# Patient Record
Sex: Female | Born: 1965 | Race: Black or African American | Hispanic: No | Marital: Single | Smoking: Never smoker
Health system: Southern US, Community
[De-identification: ages and names within clinical notes are randomized; demographics above are authoritative.]

## PROBLEM LIST (undated history)

## (undated) DIAGNOSIS — Z862 Personal history of diseases of the blood and blood-forming organs and certain disorders involving the immune mechanism: Secondary | ICD-10-CM

## (undated) DIAGNOSIS — D219 Benign neoplasm of connective and other soft tissue, unspecified: Secondary | ICD-10-CM

## (undated) DIAGNOSIS — R87629 Unspecified abnormal cytological findings in specimens from vagina: Secondary | ICD-10-CM

## (undated) DIAGNOSIS — D649 Anemia, unspecified: Secondary | ICD-10-CM

## (undated) DIAGNOSIS — K259 Gastric ulcer, unspecified as acute or chronic, without hemorrhage or perforation: Secondary | ICD-10-CM

## (undated) DIAGNOSIS — N939 Abnormal uterine and vaginal bleeding, unspecified: Secondary | ICD-10-CM

## (undated) DIAGNOSIS — Z9289 Personal history of other medical treatment: Secondary | ICD-10-CM

## (undated) HISTORY — DX: Personal history of other medical treatment: Z92.89

## (undated) HISTORY — DX: Personal history of diseases of the blood and blood-forming organs and certain disorders involving the immune mechanism: Z86.2

## (undated) HISTORY — PX: GALLBLADDER SURGERY: SHX652

## (undated) HISTORY — PX: CHOLECYSTECTOMY: SHX55

## (undated) HISTORY — DX: Benign neoplasm of connective and other soft tissue, unspecified: D21.9

## (undated) HISTORY — PX: BREAST SURGERY: SHX581

## (undated) HISTORY — PX: TUBAL LIGATION: SHX77

## (undated) HISTORY — DX: Abnormal uterine and vaginal bleeding, unspecified: N93.9

## (undated) HISTORY — DX: Unspecified abnormal cytological findings in specimens from vagina: R87.629

---
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
## 1999-07-25 ENCOUNTER — Emergency Department (HOSPITAL_COMMUNITY): Admission: EM | Admit: 1999-07-25 | Discharge: 1999-07-25 | Payer: Self-pay | Admitting: Emergency Medicine

## 1999-07-26 ENCOUNTER — Encounter: Payer: Self-pay | Admitting: Emergency Medicine

## 2000-01-11 ENCOUNTER — Emergency Department (HOSPITAL_COMMUNITY): Admission: EM | Admit: 2000-01-11 | Discharge: 2000-01-11 | Payer: Self-pay | Admitting: Emergency Medicine

## 2000-03-26 HISTORY — PX: BREAST ENHANCEMENT SURGERY: SHX7

## 2000-07-18 ENCOUNTER — Emergency Department (HOSPITAL_COMMUNITY): Admission: EM | Admit: 2000-07-18 | Discharge: 2000-07-18 | Payer: Self-pay | Admitting: Emergency Medicine

## 2000-07-28 ENCOUNTER — Emergency Department (HOSPITAL_COMMUNITY): Admission: EM | Admit: 2000-07-28 | Discharge: 2000-07-28 | Payer: Self-pay | Admitting: Emergency Medicine

## 2000-09-13 ENCOUNTER — Other Ambulatory Visit: Admission: RE | Admit: 2000-09-13 | Discharge: 2000-09-13 | Payer: Self-pay | Admitting: Gynecology

## 2003-06-03 ENCOUNTER — Ambulatory Visit (HOSPITAL_COMMUNITY): Admission: RE | Admit: 2003-06-03 | Discharge: 2003-06-03 | Payer: Self-pay | Admitting: Family Medicine

## 2003-07-07 ENCOUNTER — Emergency Department (HOSPITAL_COMMUNITY): Admission: EM | Admit: 2003-07-07 | Discharge: 2003-07-07 | Payer: Self-pay | Admitting: Emergency Medicine

## 2003-07-08 ENCOUNTER — Emergency Department (HOSPITAL_COMMUNITY): Admission: EM | Admit: 2003-07-08 | Discharge: 2003-07-08 | Payer: Self-pay | Admitting: Emergency Medicine

## 2004-02-28 ENCOUNTER — Emergency Department (HOSPITAL_COMMUNITY): Admission: EM | Admit: 2004-02-28 | Discharge: 2004-02-28 | Payer: Self-pay | Admitting: Emergency Medicine

## 2005-10-16 ENCOUNTER — Emergency Department (HOSPITAL_COMMUNITY): Admission: EM | Admit: 2005-10-16 | Discharge: 2005-10-16 | Payer: Self-pay | Admitting: Emergency Medicine

## 2005-10-18 ENCOUNTER — Emergency Department (HOSPITAL_COMMUNITY): Admission: EM | Admit: 2005-10-18 | Discharge: 2005-10-18 | Payer: Self-pay | Admitting: Emergency Medicine

## 2006-05-09 ENCOUNTER — Encounter: Admission: RE | Admit: 2006-05-09 | Discharge: 2006-05-09 | Payer: Self-pay | Admitting: Obstetrics & Gynecology

## 2007-02-15 ENCOUNTER — Emergency Department (HOSPITAL_COMMUNITY): Admission: EM | Admit: 2007-02-15 | Discharge: 2007-02-15 | Payer: Self-pay | Admitting: Emergency Medicine

## 2007-10-14 ENCOUNTER — Emergency Department (HOSPITAL_COMMUNITY): Admission: EM | Admit: 2007-10-14 | Discharge: 2007-10-14 | Payer: Self-pay | Admitting: Emergency Medicine

## 2007-10-15 ENCOUNTER — Emergency Department (HOSPITAL_COMMUNITY): Admission: EM | Admit: 2007-10-15 | Discharge: 2007-10-15 | Payer: Self-pay | Admitting: Emergency Medicine

## 2007-10-16 ENCOUNTER — Emergency Department (HOSPITAL_COMMUNITY): Admission: EM | Admit: 2007-10-16 | Discharge: 2007-10-16 | Payer: Self-pay | Admitting: Family Medicine

## 2008-06-18 ENCOUNTER — Encounter: Admission: RE | Admit: 2008-06-18 | Discharge: 2008-06-18 | Payer: Self-pay | Admitting: Obstetrics & Gynecology

## 2008-06-23 ENCOUNTER — Encounter: Admission: RE | Admit: 2008-06-23 | Discharge: 2008-06-23 | Payer: Self-pay | Admitting: Obstetrics & Gynecology

## 2008-12-14 ENCOUNTER — Emergency Department (HOSPITAL_COMMUNITY): Admission: EM | Admit: 2008-12-14 | Discharge: 2008-12-14 | Payer: Self-pay | Admitting: Emergency Medicine

## 2009-08-08 ENCOUNTER — Emergency Department (HOSPITAL_COMMUNITY): Admission: EM | Admit: 2009-08-08 | Discharge: 2009-08-08 | Payer: Self-pay | Admitting: Family Medicine

## 2010-06-12 LAB — POCT URINALYSIS DIP (DEVICE)
Glucose, UA: NEGATIVE mg/dL
Hgb urine dipstick: NEGATIVE
Ketones, ur: NEGATIVE mg/dL

## 2010-06-12 LAB — POCT I-STAT, CHEM 8
BUN: 4 mg/dL — ABNORMAL LOW (ref 6–23)
BUN: 4 mg/dL — ABNORMAL LOW (ref 6–23)
Calcium, Ion: 1.1 mmol/L — ABNORMAL LOW (ref 1.12–1.32)
Calcium, Ion: 1.11 mmol/L — ABNORMAL LOW (ref 1.12–1.32)
Chloride: 106 mEq/L (ref 96–112)
HCT: 30 % — ABNORMAL LOW (ref 36.0–46.0)
Hemoglobin: 10.2 g/dL — ABNORMAL LOW (ref 12.0–15.0)
Potassium: 4.1 mEq/L (ref 3.5–5.1)
Sodium: 142 mEq/L (ref 135–145)

## 2010-06-12 LAB — WET PREP, GENITAL
Trich, Wet Prep: NONE SEEN
Yeast Wet Prep HPF POC: NONE SEEN

## 2010-12-22 LAB — CBC
HCT: 31.7 — ABNORMAL LOW
Hemoglobin: 10.1 — ABNORMAL LOW
MCHC: 31.7
MCV: 70.5 — ABNORMAL LOW
Platelets: 325
RBC: 4.5
RDW: 17 — ABNORMAL HIGH
WBC: 7.2

## 2010-12-22 LAB — DIFFERENTIAL
Basophils Absolute: 0.1
Eosinophils Relative: 1
Lymphocytes Relative: 28
Neutrophils Relative %: 64

## 2010-12-22 LAB — POCT I-STAT, CHEM 8
BUN: 6
Calcium, Ion: 1.16
Calcium, Ion: 1.2
Chloride: 103
Chloride: 105
Creatinine, Ser: 1.2
Glucose, Bld: 104 — ABNORMAL HIGH
HCT: 33 — ABNORMAL LOW
HCT: 34 — ABNORMAL LOW
Potassium: 4.4

## 2010-12-22 LAB — POCT PREGNANCY, URINE
Operator id: 24446
Operator id: 264031
Preg Test, Ur: NEGATIVE
Preg Test, Ur: NEGATIVE

## 2010-12-22 LAB — POCT H PYLORI SCREEN: H. PYLORI SCREEN, POC: POSITIVE — AB

## 2010-12-22 LAB — LIPASE, BLOOD: Lipase: 30

## 2010-12-22 LAB — COMPREHENSIVE METABOLIC PANEL
CO2: 27
Calcium: 8.8
Chloride: 109
Creatinine, Ser: 0.84
GFR calc non Af Amer: >60
Glucose, Bld: 102 — ABNORMAL HIGH
Total Bilirubin: 1.7 — ABNORMAL HIGH

## 2010-12-22 LAB — URINE MICROSCOPIC-ADD ON

## 2010-12-22 LAB — URINALYSIS, ROUTINE W REFLEX MICROSCOPIC
Glucose, UA: NEGATIVE
Hgb urine dipstick: NEGATIVE
Hgb urine dipstick: NEGATIVE
Ketones, ur: NEGATIVE
Nitrite: NEGATIVE
Protein, ur: NEGATIVE
Protein, ur: NEGATIVE
Specific Gravity, Urine: 1.009
Specific Gravity, Urine: 1.011
Urobilinogen, UA: 0.2
Urobilinogen, UA: 1
pH: 6

## 2010-12-22 LAB — POCT CARDIAC MARKERS: Troponin i, poc: <0.05

## 2011-01-14 ENCOUNTER — Emergency Department (HOSPITAL_COMMUNITY)
Admission: EM | Admit: 2011-01-14 | Discharge: 2011-01-14 | Disposition: A | Discharge status: Home / Self Care | Payer: Self-pay | Attending: Emergency Medicine | Admitting: Emergency Medicine

## 2011-01-14 DIAGNOSIS — R059 Cough, unspecified: Secondary | ICD-10-CM | POA: Insufficient documentation

## 2011-01-14 DIAGNOSIS — J329 Chronic sinusitis, unspecified: Secondary | ICD-10-CM | POA: Insufficient documentation

## 2011-01-14 DIAGNOSIS — J069 Acute upper respiratory infection, unspecified: Secondary | ICD-10-CM | POA: Insufficient documentation

## 2011-01-14 DIAGNOSIS — R05 Cough: Secondary | ICD-10-CM | POA: Insufficient documentation

## 2011-01-14 DIAGNOSIS — R509 Fever, unspecified: Secondary | ICD-10-CM | POA: Insufficient documentation

## 2011-08-02 ENCOUNTER — Emergency Department (HOSPITAL_COMMUNITY): Payer: Self-pay

## 2011-08-02 ENCOUNTER — Emergency Department (HOSPITAL_COMMUNITY)
Admission: EM | Admit: 2011-08-02 | Discharge: 2011-08-02 | Disposition: A | Discharge status: Home / Self Care | Payer: Self-pay | Attending: Emergency Medicine | Admitting: Emergency Medicine

## 2011-08-02 ENCOUNTER — Encounter (HOSPITAL_COMMUNITY): Payer: Self-pay | Admitting: Emergency Medicine

## 2011-08-02 DIAGNOSIS — R079 Chest pain, unspecified: Secondary | ICD-10-CM | POA: Insufficient documentation

## 2011-08-02 DIAGNOSIS — D649 Anemia, unspecified: Secondary | ICD-10-CM | POA: Insufficient documentation

## 2011-08-02 DIAGNOSIS — M549 Dorsalgia, unspecified: Secondary | ICD-10-CM | POA: Insufficient documentation

## 2011-08-02 LAB — CBC
Hemoglobin: 7.9 g/dL — ABNORMAL LOW (ref 12.0–15.0)
MCH: 18.9 pg — ABNORMAL LOW (ref 26.0–34.0)
MCHC: 28.6 g/dL — ABNORMAL LOW (ref 30.0–36.0)
Platelets: 467 10*3/uL — ABNORMAL HIGH (ref 150–400)
RBC: 4.19 MIL/uL (ref 3.87–5.11)

## 2011-08-02 LAB — BASIC METABOLIC PANEL
BUN: 8 mg/dL (ref 6–23)
Chloride: 106 mEq/L (ref 96–112)
GFR calc Af Amer: >90 mL/min (ref >90)
GFR calc non Af Amer: 88 mL/min — ABNORMAL LOW (ref >90)
Potassium: 3.5 mEq/L (ref 3.5–5.1)
Sodium: 140 mEq/L (ref 135–145)

## 2011-08-02 LAB — DIFFERENTIAL
Basophils Relative: 1 % (ref 0–1)
Eosinophils Absolute: 0.3 10*3/uL (ref 0.0–0.7)
Lymphs Abs: 3.1 10*3/uL (ref 0.7–4.0)
Monocytes Relative: 6 % (ref 3–12)
Neutro Abs: 3.5 10*3/uL (ref 1.7–7.7)
Neutrophils Relative %: 47 % (ref 43–77)

## 2011-08-02 MED ORDER — FERROUS SULFATE 325 (65 FE) MG PO TABS: 325.0000 mg | ORAL_TABLET | Freq: Every day | ORAL | Status: DC

## 2011-08-02 NOTE — ED Provider Notes (Signed)
History     CSN: [id number]  Arrival date & time 08/02/11  0202   First MD Initiated Contact with Patient 08/02/11 0235      Chief Complaint  Patient presents with  . Chest Pain    (Consider location/radiation/quality/duration/timing/severity/associated sxs/prior treatment) HPI Patient presents with complaint of chest pain. She states the chest pain is located in her mid sternum and also feels some mild pain in her upper back. She denies shortness of breath nausea or diaphoresis. Pain does not radiate. She states she has had similar pain previously off and on. She's had no fever or cough. No history of DVT or PE. No leg swelling, no recent surgery or, or recent travel.  Pain is present at rest, no association with exertion, there are no other associated systemic symptoms, there are no alleviating or modifying factors.   History reviewed. No pertinent past medical history.  Past Surgical History  Procedure Date  . Cholecystectomy   . Breast enhancement surgery     No family history on file.  History  Substance Use Topics  . Smoking status: Never Smoker   . Smokeless tobacco: Not on file  . Alcohol Use: No    OB History    Grav Para Term Preterm Abortions TAB SAB Ect Mult Living                  Review of Systems ROS reviewed and all otherwise negative except for mentioned in HPI  Allergies  Review of patient's allergies indicates no known allergies.  Home Medications   Current Outpatient Rx  Name Route Sig Dispense Refill  . FERROUS SULFATE 325 (65 FE) MG PO TABS Oral Take 1 tablet (325 mg total) by mouth daily. 30 tablet 0    BP 110/67  Pulse 66  Temp(Src) 97.9 F (36.6 C) (Oral)  Resp 17  SpO2 99%  LMP 07/25/2011 Vitals reviewed Physical Exam Physical Examination: General appearance - alert, well appearing, and in no distress Mental status - alert, oriented to person, place, and time Eyes - pupils equal and reactive, no conjunctival injection Mouth -  mucous membranes moist, pharynx normal without lesions Chest - clear to auscultation, no wheezes, rales or rhonchi, symmetric air entry, mild tenderness to palpation over anterior chest wall Heart - normal rate, regular rhythm, normal S1, S2, no murmurs, rubs, clicks or gallops Abdomen - soft, nontender, nondistended, no masses or organomegaly Musculoskeletal - no joint tenderness, deformity or swelling Extremities - peripheral pulses normal, no pedal edema, no clubbing or cyanosis Skin - normal coloration and turgor, no rashes, brisk cap refill  ED Course  Procedures (including critical care time)   Date: 08/02/2011  Rate: 71  Rhythm: normal sinus rhythm with sinus arrythmia  QRS Axis: normal  Intervals: normal  ST/T Wave abnormalities: normal  Conduction Disutrbances: none  Narrative Interpretation: unremarkable     3:06 AM labs not crossing over - istat troponin 0.00  5:59 AM 2nd, 3 hour troponin also 0.00.  Labs Reviewed  CBC - Abnormal; Notable for the following:    Hemoglobin 7.9 (*)    HCT 27.6 (*)    MCV 65.9 (*)    MCH 18.9 (*)    MCHC 28.6 (*)    RDW 18.5 (*)    Platelets 467 (*)    All other components within normal limits  BASIC METABOLIC PANEL - Abnormal; Notable for the following:    Glucose, Bld 106 (*)    GFR calc non Af Denyse Dago  88 (*)    All other components within normal limits  DIFFERENTIAL  LAB REPORT - SCANNED  POCT I-STAT TROPONIN I  POCT I-STAT TROPONIN I   No results found.   1. Chest pain   2. Anemia       MDM  Patient presents with complaint of chest pain. Pain is somewhat reproducible on exam it is not associated with exertion. Patient has low risk of a CVS and is also per score 0 which makes her very low risk for PE. She was found to be anemic on workup in the ED tonight. She states she has had a history of anemia in the past but has not taken any medication or had any other treatments for this. She denies any feelings of  lightheadedness or fainting. She's not short of breath. Patient was started on ferrous sulfate to take 3 times daily and was instructed to arrange for followup with her primary care Dr.         Ethelda Chick, MD 08/04/11 [phone]

## 2011-08-02 NOTE — Discharge Instructions (Signed)
Return to the ED with any concerns including difficulty breathing, fainting, leg swelling, decreased level of alertness/lethargy, or any other alarming symptoms °

## 2011-08-02 NOTE — ED Notes (Signed)
Dr Karma Ganja notified of 7.9 hemaglobin

## 2011-08-02 NOTE — ED Notes (Addendum)
Pt awoken to take orthostatic vs.  Pt stated increased chest pain when laid flat for orthostatics and stated immediate decreased cp when moved to sitting position.

## 2011-08-02 NOTE — ED Notes (Signed)
I-stat troponin result given to Dr. Karma Ganja due to results no crossing over in [facility]

## 2011-08-02 NOTE — ED Notes (Signed)
Pt awoken to give d/c papers.  Pt states she works 2 full-time jobs and is exhausted.

## 2011-08-02 NOTE — ED Notes (Signed)
PT. REPORTS MID CHEST PAIN RADIATING TO MID BACK WITH NAUSEA ONSET THIS MORNING , DENIES SOB OR COUGH.

## 2011-08-03 LAB — POCT I-STAT TROPONIN I: Troponin i, poc: 0 ng/mL (ref 0.00–0.08)

## 2011-09-17 DIAGNOSIS — R109 Unspecified abdominal pain: Secondary | ICD-10-CM | POA: Insufficient documentation

## 2011-09-17 DIAGNOSIS — R197 Diarrhea, unspecified: Secondary | ICD-10-CM | POA: Insufficient documentation

## 2011-09-18 ENCOUNTER — Encounter (HOSPITAL_COMMUNITY): Payer: Self-pay | Admitting: Emergency Medicine

## 2011-09-18 ENCOUNTER — Emergency Department (HOSPITAL_COMMUNITY): Payer: Self-pay

## 2011-09-18 ENCOUNTER — Emergency Department (HOSPITAL_COMMUNITY)
Admission: EM | Admit: 2011-09-18 | Discharge: 2011-09-18 | Disposition: A | Discharge status: Home / Self Care | Payer: Self-pay | Attending: Emergency Medicine | Admitting: Emergency Medicine

## 2011-09-18 DIAGNOSIS — R197 Diarrhea, unspecified: Secondary | ICD-10-CM

## 2011-09-18 HISTORY — DX: Anemia, unspecified: D64.9

## 2011-09-18 HISTORY — DX: Gastric ulcer, unspecified as acute or chronic, without hemorrhage or perforation: K25.9

## 2011-09-18 LAB — BASIC METABOLIC PANEL
Calcium: 8.8 mg/dL (ref 8.4–10.5)
GFR calc Af Amer: >90 mL/min (ref >90)
GFR calc non Af Amer: 89 mL/min — ABNORMAL LOW (ref >90)
Glucose, Bld: 103 mg/dL — ABNORMAL HIGH (ref 70–99)
Potassium: 3.3 mEq/L — ABNORMAL LOW (ref 3.5–5.1)
Sodium: 135 mEq/L (ref 135–145)

## 2011-09-18 LAB — CBC
HCT: 28.6 % — ABNORMAL LOW (ref 36.0–46.0)
Hemoglobin: 8.2 g/dL — ABNORMAL LOW (ref 12.0–15.0)
MCH: 18.6 pg — ABNORMAL LOW (ref 26.0–34.0)
MCHC: 28.7 g/dL — ABNORMAL LOW (ref 30.0–36.0)
MCV: 64.9 fL — ABNORMAL LOW (ref 78.0–100.0)
Platelets: 302 K/uL (ref 150–400)
RBC: 4.41 MIL/uL (ref 3.87–5.11)
RDW: 18.7 % — ABNORMAL HIGH (ref 11.5–15.5)
WBC: 6.3 K/uL (ref 4.0–10.5)

## 2011-09-18 LAB — DIFFERENTIAL
Basophils Absolute: 0.1 10*3/uL (ref 0.0–0.1)
Eosinophils Absolute: 0.3 10*3/uL (ref 0.0–0.7)
Lymphocytes Relative: 30 % (ref 12–46)
Monocytes Absolute: 0.4 10*3/uL (ref 0.1–1.0)
Neutrophils Relative %: 58 % (ref 43–77)

## 2011-09-18 LAB — URINALYSIS, ROUTINE W REFLEX MICROSCOPIC
Hgb urine dipstick: NEGATIVE
Nitrite: NEGATIVE
Protein, ur: NEGATIVE mg/dL
Urobilinogen, UA: 4 mg/dL — ABNORMAL HIGH (ref 0.0–1.0)

## 2011-09-18 LAB — PREGNANCY, URINE: Preg Test, Ur: NEGATIVE

## 2011-09-18 LAB — OCCULT BLOOD, POC DEVICE: Fecal Occult Bld: NEGATIVE

## 2011-09-18 NOTE — ED Notes (Signed)
Warm blanket given for pt comfort.

## 2011-09-18 NOTE — ED Notes (Signed)
Pt states for a few days she has been bloating and having uncontrollable bowel movements  States her stools are black in color  Pt states she has been having abd pain  Pt states she also noticed her urine has been dark in color  Pt states she is anemic and has a low blood count  Pt states she has been eating a lot of ice  Pt states she has also had some shortness of breath and feeling like she may pass out at time but states she has not done it yet

## 2011-09-18 NOTE — ED Notes (Signed)
ABD pain controlled/ decreased, VSS, no s/s of distress or discomfort. D/C instructions reviewed, all questions and concerns addressed. Pt discharged home.

## 2011-09-18 NOTE — ED Provider Notes (Signed)
History     CSN: [id number]  Arrival date & time 09/17/11  2323   First MD Initiated Contact with Patient 09/18/11 [phone]      Chief Complaint  Patient presents with  . Abdominal Pain     The history is provided by the patient.   the patient reports developing significant loose stools over the past 24 hours.  She denies melena or hematochezia.  She does report abdominal bloating.  Prior to that she had decreased bowel movements.  She denies fevers or chills.  She does not have abdominal pain.  Her abdomen is reported as bloating. nothing Improves or worsens the symptoms.   Past Medical History  Diagnosis Date  . Anemia   . Gastric ulcer     Past Surgical History  Procedure Date  . Cholecystectomy   . Breast enhancement surgery     Family History  Problem Relation Age of Onset  . Hypertension Other   . Cancer Other   . Hyperlipidemia Other     History  Substance Use Topics  . Smoking status: Never Smoker   . Smokeless tobacco: Not on file  . Alcohol Use: No    OB History    Grav Para Term Preterm Abortions TAB SAB Ect Mult Living                  Review of Systems  Gastrointestinal: Positive for abdominal pain.  All other systems reviewed and are negative.    Allergies  Review of patient's allergies indicates no known allergies.  Home Medications   Current Outpatient Rx  Name Route Sig Dispense Refill  . FERROUS SULFATE 325 (65 FE) MG PO TABS Oral Take 1 tablet (325 mg total) by mouth daily. 30 tablet 0    BP 101/62  Pulse 58  Temp 98 F (36.7 C) (Oral)  Resp 20  SpO2 100%  LMP 09/17/2011  Physical Exam  Nursing note and vitals reviewed. Constitutional: She is oriented to person, place, and time. She appears well-developed and well-nourished. No distress.  HENT:  Head: Normocephalic and atraumatic.  Eyes: EOM are normal.  Neck: Normal range of motion.  Cardiovascular: Normal rate, regular rhythm and normal heart sounds.   Pulmonary/Chest:  Effort normal and breath sounds normal.  Abdominal: Soft. She exhibits no distension. There is no tenderness.  Musculoskeletal: Normal range of motion.  Neurological: She is alert and oriented to person, place, and time.  Skin: Skin is warm and dry.  Psychiatric: She has a normal mood and affect. Judgment normal.    ED Course  Procedures (including critical care time)  Labs Reviewed  CBC - Abnormal; Notable for the following:    Hemoglobin 8.2 (*)  REPEATED TO VERIFY   HCT 28.6 (*)     MCV 64.9 (*)     MCH 18.6 (*)     MCHC 28.7 (*)     RDW 18.7 (*)     All other components within normal limits  BASIC METABOLIC PANEL - Abnormal; Notable for the following:    Potassium 3.3 (*)     Glucose, Bld 103 (*)     GFR calc non Af Amer 89 (*)     All other components within normal limits  URINALYSIS, ROUTINE W REFLEX MICROSCOPIC - Abnormal; Notable for the following:    Urobilinogen, UA 4.0 (*)     All other components within normal limits  DIFFERENTIAL  PREGNANCY, URINE   Dg Abd 2 Views  09/18/2011  *  RADIOLOGY REPORT*  Clinical Data: Abdominal pain, nausea and vomiting.  ABDOMEN - 2 VIEW  Comparison: 10/15/2007  Findings: Borderline prominence gas and stool noted in the colon. No dilated small bowel or abnormal air-fluid levels noted.  Mild dextroconvex thoracolumbar scoliosis is observed.  IMPRESSION:  1. Prominence of stool throughout the colon suggests constipation. 2.  Mild thoracolumbar scoliosis.  Original Report Authenticated By: Dellia Cloud, M.D.     1. Diarrhea       MDM  Radiographically the patient has increase stool burden.  Some of this may represent constipation with diarrhea loose stools coming around the stool burden.  I recommended a short course stool softeners to see if this helps.  She understands importance of hydration at home.  She will return the emergency apartment for new or worsening symptoms.      Lyanne Co, MD 09/18/11 [phone]

## 2012-06-09 ENCOUNTER — Emergency Department (HOSPITAL_COMMUNITY)
Admission: EM | Admit: 2012-06-09 | Discharge: 2012-06-09 | Disposition: A | Discharge status: Home / Self Care | Payer: No Typology Code available for payment source | Attending: Emergency Medicine | Admitting: Emergency Medicine

## 2012-06-09 ENCOUNTER — Emergency Department (HOSPITAL_COMMUNITY): Payer: No Typology Code available for payment source

## 2012-06-09 ENCOUNTER — Encounter (HOSPITAL_COMMUNITY): Payer: Self-pay | Admitting: *Deleted

## 2012-06-09 DIAGNOSIS — R109 Unspecified abdominal pain: Secondary | ICD-10-CM | POA: Insufficient documentation

## 2012-06-09 DIAGNOSIS — R42 Dizziness and giddiness: Secondary | ICD-10-CM | POA: Insufficient documentation

## 2012-06-09 DIAGNOSIS — Z3202 Encounter for pregnancy test, result negative: Secondary | ICD-10-CM | POA: Insufficient documentation

## 2012-06-09 DIAGNOSIS — R110 Nausea: Secondary | ICD-10-CM | POA: Insufficient documentation

## 2012-06-09 DIAGNOSIS — Z862 Personal history of diseases of the blood and blood-forming organs and certain disorders involving the immune mechanism: Secondary | ICD-10-CM | POA: Insufficient documentation

## 2012-06-09 DIAGNOSIS — R197 Diarrhea, unspecified: Secondary | ICD-10-CM | POA: Insufficient documentation

## 2012-06-09 DIAGNOSIS — R079 Chest pain, unspecified: Secondary | ICD-10-CM | POA: Insufficient documentation

## 2012-06-09 DIAGNOSIS — Z8711 Personal history of peptic ulcer disease: Secondary | ICD-10-CM | POA: Insufficient documentation

## 2012-06-09 LAB — URINALYSIS, ROUTINE W REFLEX MICROSCOPIC
Leukocytes, UA: NEGATIVE
Nitrite: NEGATIVE
Specific Gravity, Urine: 1.029 (ref 1.005–1.030)
pH: 5 (ref 5.0–8.0)

## 2012-06-09 LAB — CBC WITH DIFFERENTIAL/PLATELET
Basophils Absolute: 0 10*3/uL (ref 0.0–0.1)
Eosinophils Absolute: 0.2 10*3/uL (ref 0.0–0.7)
Lymphocytes Relative: 12 % (ref 12–46)
MCHC: 28.2 g/dL — ABNORMAL LOW (ref 30.0–36.0)
Neutro Abs: 6.3 10*3/uL (ref 1.7–7.7)
Neutrophils Relative %: 78 % — ABNORMAL HIGH (ref 43–77)
Platelets: 428 10*3/uL — ABNORMAL HIGH (ref 150–400)
RDW: 18.6 % — ABNORMAL HIGH (ref 11.5–15.5)

## 2012-06-09 LAB — POCT I-STAT TROPONIN I
Troponin i, poc: 0 ng/mL (ref 0.00–0.08)
Troponin i, poc: 0 ng/mL (ref 0.00–0.08)
Troponin i, poc: 0.01 ng/mL (ref 0.00–0.08)

## 2012-06-09 LAB — COMPREHENSIVE METABOLIC PANEL
Albumin: 3.8 g/dL (ref 3.5–5.2)
Alkaline Phosphatase: 54 U/L (ref 39–117)
BUN: 9 mg/dL (ref 6–23)
Potassium: 3.3 mEq/L — ABNORMAL LOW (ref 3.5–5.1)
Sodium: 138 mEq/L (ref 135–145)
Total Protein: 7.5 g/dL (ref 6.0–8.3)

## 2012-06-09 LAB — LIPASE, BLOOD: Lipase: 32 U/L (ref 11–59)

## 2012-06-09 LAB — POCT PREGNANCY, URINE: Preg Test, Ur: NEGATIVE

## 2012-06-09 MED ORDER — ONDANSETRON 4 MG PO TBDP: 4.0000 mg | ORAL_TABLET | Freq: Three times a day (TID) | ORAL | Status: DC | PRN

## 2012-06-09 MED ORDER — SODIUM CHLORIDE 0.9 % IV BOLUS (SEPSIS)
1000.0000 mL | Freq: Once | INTRAVENOUS | Status: AC
  Administered 2012-06-09: 1000 mL via INTRAVENOUS

## 2012-06-09 MED ORDER — MORPHINE SULFATE 4 MG/ML IJ SOLN
4.0000 mg | Freq: Once | INTRAMUSCULAR | Status: AC
  Administered 2012-06-09: 4 mg via INTRAVENOUS
  Filled 2012-06-09: qty 1

## 2012-06-09 MED ORDER — GI COCKTAIL ~~LOC~~
30.0000 mL | Freq: Once | ORAL | Status: AC
  Administered 2012-06-09: 30 mL via ORAL
  Filled 2012-06-09: qty 30

## 2012-06-09 MED ORDER — ONDANSETRON HCL 4 MG/2ML IJ SOLN
4.0000 mg | Freq: Once | INTRAMUSCULAR | Status: AC
  Administered 2012-06-09: 4 mg via INTRAVENOUS
  Filled 2012-06-09: qty 2

## 2012-06-09 NOTE — ED Notes (Signed)
Esign would not work.    Patient did sign on the pad.

## 2012-06-09 NOTE — ED Provider Notes (Signed)
Medical screening examination/treatment/procedure(s) were conducted as a shared visit with non-physician practitioner(s) and myself.  I personally evaluated the patient during the encounter  Nausea, vomiting, diarrhea x 2 days with sick contacts. Abdomen soft without peritoneal signs.  Glynn Octave, MD 06/09/12 [phone]

## 2012-06-09 NOTE — ED Notes (Signed)
Pt is here with couple days of nausea and diarrhea.  Pt reports dizziness and weakness

## 2012-06-09 NOTE — ED Provider Notes (Signed)
History     CSN: [id number]  Arrival date & time 06/09/12  0910   First MD Initiated Contact with Patient 06/09/12 [phone]      Chief Complaint  Patient presents with  . Nausea  . Diarrhea  . Dizziness    (Consider location/radiation/quality/duration/timing/severity/associated sxs/prior treatment) HPI Comments: Pt presents to the ED for nausea and non-bloody diarrhea x 2 days.  Has been taking care of her grandchild who had similar symptoms.  Continues to eat and drink in small amounts.  Is now having diffuse, crampy, abdominal pain with some sensations of light-headedness with abrupt movements.  Denies any chest pain, SOB, vomiting, dysuria, or vaginal discharge.  Prior cholecystectomy.  Patient is a 47 y.o. female presenting with diarrhea. The history is provided by the patient.  Diarrhea   Past Medical History  Diagnosis Date  . Anemia   . Gastric ulcer     Past Surgical History  Procedure Laterality Date  . Cholecystectomy    . Breast enhancement surgery      Family History  Problem Relation Age of Onset  . Hypertension Other   . Cancer Other   . Hyperlipidemia Other     History  Substance Use Topics  . Smoking status: Never Smoker   . Smokeless tobacco: Not on file  . Alcohol Use: No    OB History   Grav Para Term Preterm Abortions TAB SAB Ect Mult Living                  Review of Systems  Gastrointestinal: Positive for nausea and diarrhea.  Neurological: Positive for light-headedness.  All other systems reviewed and are negative.    Allergies  Review of patient's allergies indicates no known allergies.  Home Medications  No current outpatient prescriptions on file.  BP 106/66  Pulse 83  Temp(Src) 98.5 F (36.9 C) (Oral)  Resp 18  SpO2 100%  Physical Exam  Nursing note and vitals reviewed. Constitutional: She is oriented to person, place, and time. She appears well-developed and well-nourished.  HENT:  Head: Normocephalic and  atraumatic.  Right Ear: Tympanic membrane and ear canal normal.  Left Ear: Tympanic membrane and ear canal normal.  Mouth/Throat: Uvula is midline, oropharynx is clear and moist and mucous membranes are normal. No oropharyngeal exudate, posterior oropharyngeal erythema or tonsillar abscesses.  Eyes: Conjunctivae and EOM are normal.  Neck: Normal range of motion. No rigidity. Normal range of motion present.  Cardiovascular: Normal rate, regular rhythm and normal heart sounds.   Pulmonary/Chest: Effort normal and breath sounds normal. She has no wheezes.  Abdominal: Soft. Bowel sounds are normal. There is tenderness (diffuse). There is no CVA tenderness.  Musculoskeletal: Normal range of motion. She exhibits no edema.  Lymphadenopathy:    She has no cervical adenopathy.  Neurological: She is alert and oriented to person, place, and time.  Skin: Skin is warm and dry.  Psychiatric: She has a normal mood and affect.    ED Course  Procedures (including critical care time)   Date: 06/09/2012  Rate: 80  Rhythm: normal sinus rhythm  QRS Axis: normal  Intervals: normal  ST/T Wave abnormalities: nonspecific ST changes  Conduction Disutrbances:none  Narrative Interpretation:   Old EKG Reviewed: changes noted   Labs Reviewed  CBC WITH DIFFERENTIAL - Abnormal; Notable for the following:    Hemoglobin 8.2 (*)    HCT 29.1 (*)    MCV 64.0 (*)    MCH 18.0 (*)    MCHC  28.2 (*)    RDW 18.6 (*)    Platelets 428 (*)    Neutrophils Relative 78 (*)    All other components within normal limits  COMPREHENSIVE METABOLIC PANEL - Abnormal; Notable for the following:    Potassium 3.3 (*)    Glucose, Bld 107 (*)    GFR calc non Af Amer 88 (*)    All other components within normal limits  URINALYSIS, ROUTINE W REFLEX MICROSCOPIC - Abnormal; Notable for the following:    Color, Urine AMBER (*)    APPearance HAZY (*)    Bilirubin Urine SMALL (*)    Ketones, ur 15 (*)    All other components within  normal limits  URINE CULTURE  LIPASE, BLOOD  POCT PREGNANCY, URINE  POCT I-STAT TROPONIN I  POCT I-STAT TROPONIN I  POCT I-STAT TROPONIN I   Dg Chest 2 View  06/09/2012  *RADIOLOGY REPORT*  Clinical Data: Nausea, chest pain, dizziness, diarrhea  CHEST - 2 VIEW  Comparison:  08/02/2011  Findings:  The heart size and mediastinal contours are within normal limits.  Both lungs are clear.  The visualized skeletal structures are unremarkable.  IMPRESSION: No active cardiopulmonary disease.   Original Report Authenticated By: Judie Petit. Shick, M.D.      1. Nausea   2. Diarrhea   3. Chest pain       MDM   47 y.o. Female presenting to ED for diarrhea and nausea x 2 days.  Also complains of intermittent lightheadedness following abrupt movements.  Recent sick exposure while taking care of grandson who had similar sx.  Labs consistent with hx of anemia.  Urine culture pending.  Good resolution of sx with IVF, zofran, and morphine.  Began experiencing CP prior to d/c- Cardiac workup including trop x2 negative.  Tolerating PO fluids without difficulty prior to d/c.  VS stable.  Given rx of zofran for home use.  Encouraged to establish with PCP- given resource list.  Return precautions discussed.        Garlon Hatchet, PA-C 06/09/12 [phone]

## 2012-06-09 NOTE — ED Notes (Signed)
Patient states she has been having diarrhea off and on since Saturday night.  Patient complains of nausea, but denies vomiting.  Patient advised when she stands up quick, she gets weak and dizzy.

## 2012-06-10 LAB — URINE CULTURE

## 2012-06-12 ENCOUNTER — Emergency Department (HOSPITAL_COMMUNITY): Payer: No Typology Code available for payment source

## 2012-06-12 ENCOUNTER — Emergency Department (HOSPITAL_COMMUNITY)
Admission: EM | Admit: 2012-06-12 | Discharge: 2012-06-12 | Disposition: A | Discharge status: Home / Self Care | Payer: No Typology Code available for payment source | Attending: Emergency Medicine | Admitting: Emergency Medicine

## 2012-06-12 ENCOUNTER — Encounter (HOSPITAL_COMMUNITY): Payer: Self-pay

## 2012-06-12 ENCOUNTER — Emergency Department (HOSPITAL_COMMUNITY)
Admission: EM | Admit: 2012-06-12 | Discharge: 2012-06-12 | Disposition: A | Discharge status: Other Acute Inpatient Hospital | Payer: No Typology Code available for payment source

## 2012-06-12 DIAGNOSIS — R059 Cough, unspecified: Secondary | ICD-10-CM | POA: Insufficient documentation

## 2012-06-12 DIAGNOSIS — R509 Fever, unspecified: Secondary | ICD-10-CM | POA: Insufficient documentation

## 2012-06-12 DIAGNOSIS — R52 Pain, unspecified: Secondary | ICD-10-CM | POA: Insufficient documentation

## 2012-06-12 DIAGNOSIS — R51 Headache: Secondary | ICD-10-CM | POA: Insufficient documentation

## 2012-06-12 DIAGNOSIS — R05 Cough: Secondary | ICD-10-CM | POA: Insufficient documentation

## 2012-06-12 DIAGNOSIS — J3489 Other specified disorders of nose and nasal sinuses: Secondary | ICD-10-CM | POA: Insufficient documentation

## 2012-06-12 DIAGNOSIS — Z862 Personal history of diseases of the blood and blood-forming organs and certain disorders involving the immune mechanism: Secondary | ICD-10-CM | POA: Insufficient documentation

## 2012-06-12 DIAGNOSIS — R000 Tachycardia, unspecified: Secondary | ICD-10-CM | POA: Insufficient documentation

## 2012-06-12 DIAGNOSIS — J069 Acute upper respiratory infection, unspecified: Secondary | ICD-10-CM | POA: Insufficient documentation

## 2012-06-12 DIAGNOSIS — Z8719 Personal history of other diseases of the digestive system: Secondary | ICD-10-CM | POA: Insufficient documentation

## 2012-06-12 MED ORDER — HYDROCODONE-ACETAMINOPHEN 5-325 MG PO TABS
1.0000 | ORAL_TABLET | Freq: Once | ORAL | Status: AC
  Administered 2012-06-12: 1 via ORAL
  Filled 2012-06-12: qty 1

## 2012-06-12 MED ORDER — OXYMETAZOLINE HCL 0.05 % NA SOLN
1.0000 | Freq: Once | NASAL | Status: AC
  Administered 2012-06-12: 1 via NASAL
  Filled 2012-06-12: qty 15

## 2012-06-12 MED ORDER — HYDROCODONE-HOMATROPINE 5-1.5 MG/5ML PO SYRP: 5.0000 mL | ORAL_SOLUTION | Freq: Four times a day (QID) | ORAL | Status: DC | PRN

## 2012-06-12 MED ORDER — ACETAMINOPHEN 325 MG PO TABS
650.0000 mg | ORAL_TABLET | Freq: Once | ORAL | Status: AC
  Administered 2012-06-12: 650 mg via ORAL
  Filled 2012-06-12: qty 2

## 2012-06-12 MED ORDER — GUAIFENESIN ER 600 MG PO TB12: 1200.0000 mg | ORAL_TABLET | Freq: Two times a day (BID) | ORAL | Status: DC

## 2012-06-12 NOTE — ED Provider Notes (Signed)
Medical screening examination/treatment/procedure(s) were performed by non-physician practitioner and as supervising physician I was immediately available for consultation/collaboration.    Khiya Friese R Leilany Digeronimo, MD 06/12/12 2349 

## 2012-06-12 NOTE — ED Provider Notes (Signed)
History  This chart was scribed for non-physician practitioner Dierdre Forth, PA-C working with Celene Kras, MD, by Candelaria Stagers, ED Scribe. This patient was seen in room WTR5/WTR5 and the patient's care was started at 6:55 PM   CSN: [id number]  Arrival date & time 06/12/12  1635   First MD Initiated Contact with Patient 06/12/12 1824      Chief Complaint  Patient presents with  . Fever  . Cough     The history is provided by the patient. No language interpreter was used.   Gabrielle Hines is a 47 y.o. female who presents to the Emergency Department complaining of generalized body aches, fever, and chills that started two days ago.  She is also experiencing a productive cough, nasal congestion, and headache.  She denies ear pain, nausea, vomiting, or diarrhea.  Pt did not get a flu shot this year.  She denies known sick contacts.  She has taken alka seltzer with no relief.  She has not been taking any other medication for fever control.    Past Medical History  Diagnosis Date  . Anemia   . Gastric ulcer     Past Surgical History  Procedure Laterality Date  . Cholecystectomy    . Breast enhancement surgery      Family History  Problem Relation Age of Onset  . Hypertension Other   . Cancer Other   . Hyperlipidemia Other     History  Substance Use Topics  . Smoking status: Never Smoker   . Smokeless tobacco: Not on file  . Alcohol Use: No    OB History   Grav Para Term Preterm Abortions TAB SAB Ect Mult Living                  Review of Systems  Constitutional: Positive for fever and chills.       Generalized body aches   HENT: Positive for congestion. Negative for ear pain.   Respiratory: Positive for cough.   Gastrointestinal: Negative for nausea and vomiting.  Neurological: Positive for headaches.  All other systems reviewed and are negative.    Allergies  Review of patient's allergies indicates no known allergies.  Home Medications    Current Outpatient Rx  Name  Route  Sig  Dispense  Refill  . Calcium Carbonate Antacid (ALKA-SELTZER ANTACID PO)   Oral   Take 1 tablet by mouth 2 (two) times daily as needed.         Marland Kitchen guaiFENesin (MUCINEX) 600 MG 12 hr tablet   Oral   Take 2 tablets (1,200 mg total) by mouth 2 (two) times daily.   30 tablet   0   . HYDROcodone-homatropine (HYCODAN) 5-1.5 MG/5ML syrup   Oral   Take 5 mLs by mouth every 6 (six) hours as needed for cough.   120 mL   0     BP 118/70  Pulse 103  Temp(Src) 102.9 F (39.4 C) (Oral)  Resp 20  SpO2 100%  LMP 05/29/2012  Physical Exam  Nursing note and vitals reviewed. Constitutional: She is oriented to person, place, and time. She appears well-developed and well-nourished. No distress.  HENT:  Head: Normocephalic and atraumatic.  Right Ear: External ear and ear canal normal. Tympanic membrane is not erythematous and not bulging.  Left Ear: Tympanic membrane, external ear and ear canal normal.  Nose: Mucosal edema and rhinorrhea present. Right sinus exhibits no maxillary sinus tenderness and no frontal sinus tenderness. Left sinus exhibits  no maxillary sinus tenderness and no frontal sinus tenderness.  Mouth/Throat: Oropharynx is clear and moist. No oropharyngeal exudate.  Fluid behind bilateral TMs, nonpurulent.   Eyes: Conjunctivae and EOM are normal. Pupils are equal, round, and reactive to light. No scleral icterus.  Neck: Normal range of motion. Neck supple.  Cardiovascular: Regular rhythm, normal heart sounds and intact distal pulses.  Exam reveals no gallop and no friction rub.   No murmur heard. tachycardic  Pulmonary/Chest: Effort normal and breath sounds normal. No respiratory distress. She has no wheezes. She has no rales. She exhibits no tenderness.  Abdominal: Soft. Bowel sounds are normal. She exhibits no distension and no mass. There is no tenderness. There is no rebound and no guarding.  Musculoskeletal: Normal range of  motion. She exhibits no edema.  Lymphadenopathy:    She has no cervical adenopathy.  Neurological: She is alert and oriented to person, place, and time. No cranial nerve deficit. She exhibits normal muscle tone. Coordination normal.  Speech is clear and goal oriented Moves extremities without ataxia  Skin: Skin is warm and dry. No rash noted. She is not diaphoretic. No erythema.  Psychiatric: She has a normal mood and affect.    ED Course  Procedures   DIAGNOSTIC STUDIES: Oxygen Saturation is 100% on room air, normal by my interpretation.    COORDINATION OF CARE:  6:59 PM Discussed course of care with pt which includes treating symptomatically.  Pt understands and agrees.  Pt request a work note.    Labs Reviewed - No data to display Dg Chest 2 View  06/12/2012  *RADIOLOGY REPORT*  Clinical Data: Cough, fever, shortness of breath  CHEST - 2 VIEW  Comparison: Chest radiograph 06/09/2012  Findings: The heart, mediastinal, and hilar contours are normal. The trachea is midline.  Pulmonary vascularity is normal.  The lungs are well expanded and clear.  There is no pleural effusion.  Cholecystectomy clips are present.  No acute osseous abnormality identified.  IMPRESSION: No acute cardiopulmonary disease.   Original Report Authenticated By: Britta Mccreedy, M.D.      1. Viral URI with cough       MDM  Gabrielle Hines presents with URI symptoms vs influenza like illness.  Pt CXR negative for acute infiltrate. Patients symptoms are consistent with URI, likely viral etiology. Discussed that antibiotics are not indicated for viral infections. Pt with mild tachycardia, but she is also febrile.  Pt treated with afrin for nasal congestion and acetaminophen for fever control.  Pt will be discharged with symptomatic treatment.  Verbalizes understanding and is agreeable with plan. Pt is hemodynamically stable & in NAD prior to dc.  1. Medications: mucinex, hycodan, usual home medications 2.  Treatment: rest, drink plenty of fluids, take tylenol or ibuprofen for fever control 3. Follow Up: Please followup with your primary doctor for discussion of your diagnoses and further evaluation after today's visit; if you do not have a primary care doctor use the resource guide provided to find one;   I personally performed the services described in this documentation, which was scribed in my presence. The recorded information has been reviewed and is accurate.     Dierdre Forth, PA-C 06/12/12 [phone]

## 2012-06-12 NOTE — ED Notes (Signed)
Pt c/o clear sputum with productive cough

## 2012-06-12 NOTE — ED Notes (Signed)
Pt c/o cough and head congestion with fever x2days, pt denies n/v/d

## 2013-02-11 ENCOUNTER — Encounter: Payer: Self-pay | Admitting: Obstetrics & Gynecology

## 2013-02-11 ENCOUNTER — Ambulatory Visit (INDEPENDENT_AMBULATORY_CARE_PROVIDER_SITE_OTHER): Payer: No Typology Code available for payment source | Admitting: Obstetrics & Gynecology

## 2013-02-11 VITALS — BP 120/69 | HR 84 | Temp 97.9°F | Ht 64.0 in | Wt 149.0 lb

## 2013-02-11 DIAGNOSIS — Z113 Encounter for screening for infections with a predominantly sexual mode of transmission: Secondary | ICD-10-CM

## 2013-02-11 DIAGNOSIS — Z01419 Encounter for gynecological examination (general) (routine) without abnormal findings: Secondary | ICD-10-CM

## 2013-02-11 DIAGNOSIS — N644 Mastodynia: Secondary | ICD-10-CM

## 2013-02-11 NOTE — Patient Instructions (Signed)

## 2013-02-11 NOTE — Progress Notes (Signed)
Subjective:     Gabrielle Hines is a 47 y.o. female here for a routine annual exam.  Current complaints: pt states that she is having some pain in upper left breast. States that pain has been present for 2-3 months. Pt describes it as a constant nagging pain. Pt preforms regular self exams and reports that her exams are normal.  Pt. Has not tried any comfort measures for the pain. Pt states she is having night sweat and irritability.  Personal health questionnaire reviewed: yes.   Gynecologic History Patient's last menstrual period was 02/04/2013. Contraception: tubal ligation Last Pap: 2010 . Results were: normal Last mammogram: 2010. Results were: normal  Obstetric History OB History  Gravida Para Term Preterm AB SAB TAB Ectopic Multiple Living  5 4 4  0 1 1 0 0 1 5    # Outcome Date GA Lbr Len/2nd Weight Sex Delivery Anes PTL Lv  5A TRM 11/14/90 [redacted]w[redacted]d  4 lb 9 oz (2.07 kg) M SVD EPI  Y  5B  11/14/90 [redacted]w[redacted]d  5 lb 6 oz (2.438 kg) F SVD EPI  Y  4 TRM 12/04/89 [redacted]w[redacted]d  6 lb 11 oz (3.033 kg) F SVD EPI  Y  3 TRM 01/11/89 [redacted]w[redacted]d  7 lb 8 oz (3.402 kg) F SVD EPI  Y  2 TRM 09/09/86 [redacted]w[redacted]d  8 lb 14 oz (4.026 kg) F SVD EPI  Y  1 SAB 1986 [redacted]w[redacted]d              The following portions of the patient's history were reviewed and updated as appropriate: allergies, current medications, past family history, past medical history, past social history, past surgical history and problem list.  Review of Systems Pertinent items are noted in HPI.    Objective:      General appearance: alert Breasts: normal appearance, implants palpated, no tenderness Abdomen: soft, non-tender; bowel sounds normal; no masses,  no organomegaly Pelvic: cervix normal in appearance, external genitalia normal, no adnexal masses or tenderness, uterus normal size, shape, and consistency and vagina normal without discharge      Assessment:   Breast pain  Plan:   MMG Return prn

## 2013-02-12 ENCOUNTER — Other Ambulatory Visit: Payer: Self-pay | Admitting: Obstetrics & Gynecology

## 2013-02-12 DIAGNOSIS — N6489 Other specified disorders of breast: Secondary | ICD-10-CM

## 2013-02-12 LAB — WET PREP BY MOLECULAR PROBE: Gardnerella vaginalis: POSITIVE — AB

## 2013-02-12 LAB — HIV ANTIBODY (ROUTINE TESTING W REFLEX): HIV: NONREACTIVE

## 2013-02-12 LAB — RPR

## 2013-02-13 ENCOUNTER — Encounter: Payer: Self-pay | Admitting: Obstetrics & Gynecology

## 2013-02-13 DIAGNOSIS — D649 Anemia, unspecified: Secondary | ICD-10-CM | POA: Insufficient documentation

## 2013-02-13 LAB — PAP IG, CT-NG NAA, HPV HIGH-RISK: HPV DNA High Risk: NOT DETECTED

## 2013-03-04 ENCOUNTER — Ambulatory Visit
Admission: RE | Admit: 2013-03-04 | Discharge: 2013-03-04 | Disposition: A | Discharge status: Home / Self Care | Payer: No Typology Code available for payment source | Source: Ambulatory Visit | Attending: Obstetrics & Gynecology | Admitting: Obstetrics & Gynecology

## 2013-03-04 DIAGNOSIS — N6489 Other specified disorders of breast: Secondary | ICD-10-CM

## 2013-03-05 ENCOUNTER — Ambulatory Visit: Payer: No Typology Code available for payment source | Admitting: Obstetrics & Gynecology

## 2013-04-13 ENCOUNTER — Encounter (HOSPITAL_COMMUNITY): Payer: Self-pay | Admitting: Emergency Medicine

## 2013-04-13 ENCOUNTER — Emergency Department (HOSPITAL_COMMUNITY)
Admission: EM | Admit: 2013-04-13 | Discharge: 2013-04-14 | Disposition: A | Discharge status: Home / Self Care | Payer: BC Managed Care – PPO | Attending: Emergency Medicine | Admitting: Emergency Medicine

## 2013-04-13 DIAGNOSIS — IMO0002 Reserved for concepts with insufficient information to code with codable children: Secondary | ICD-10-CM | POA: Insufficient documentation

## 2013-04-13 DIAGNOSIS — R55 Syncope and collapse: Secondary | ICD-10-CM | POA: Diagnosis present

## 2013-04-13 DIAGNOSIS — Y9389 Activity, other specified: Secondary | ICD-10-CM | POA: Insufficient documentation

## 2013-04-13 DIAGNOSIS — W108XXA Fall (on) (from) other stairs and steps, initial encounter: Secondary | ICD-10-CM | POA: Insufficient documentation

## 2013-04-13 DIAGNOSIS — Z3202 Encounter for pregnancy test, result negative: Secondary | ICD-10-CM | POA: Insufficient documentation

## 2013-04-13 DIAGNOSIS — Y929 Unspecified place or not applicable: Secondary | ICD-10-CM | POA: Insufficient documentation

## 2013-04-13 DIAGNOSIS — Z8711 Personal history of peptic ulcer disease: Secondary | ICD-10-CM | POA: Insufficient documentation

## 2013-04-13 DIAGNOSIS — R110 Nausea: Secondary | ICD-10-CM | POA: Insufficient documentation

## 2013-04-13 DIAGNOSIS — N390 Urinary tract infection, site not specified: Secondary | ICD-10-CM | POA: Insufficient documentation

## 2013-04-13 DIAGNOSIS — R17 Unspecified jaundice: Secondary | ICD-10-CM

## 2013-04-13 DIAGNOSIS — D649 Anemia, unspecified: Secondary | ICD-10-CM | POA: Insufficient documentation

## 2013-04-13 NOTE — ED Provider Notes (Signed)
CSN: [id number]     Arrival date & time 04/13/13  1949 History   First MD Initiated Contact with Patient 04/13/13 2229     Chief Complaint  Patient presents with  . Fall  . Back Pain  . Dizziness   (Consider location/radiation/quality/duration/timing/severity/associated sxs/prior Treatment) HPI Comments: Gabrielle Hines is a 48 y.o. year-old female with a past medical history of anemia and gastric ulcer, presenting the Emergency Department with a chief complaint of near syncope yesterday.  The patient reports after standing from a seated position she became lightheaded and reports falling down her stairs.  She states she did not have decrease or change in vision she did not have LOC or head injury.  She reports she fell on her lower back.  She reports multiple similar episodes with change of position. Patient's last menstrual period was 04/04/2013. She denies heavy period.  Denies history of IV drug use, EtOH abuse or heavy use, abdominal pain.      The history is provided by the patient and medical records. No language interpreter was used.    Past Medical History  Diagnosis Date  . Anemia   . Gastric ulcer    Past Surgical History  Procedure Laterality Date  . Cholecystectomy    . Breast enhancement surgery     Family History  Problem Relation Age of Onset  . Hypertension Other   . Cancer Other   . Hyperlipidemia Other   . Hypertension Mother   . Hypertension Father   . Cancer Maternal Grandfather    History  Substance Use Topics  . Smoking status: Never Smoker   . Smokeless tobacco: Not on file  . Alcohol Use: No   OB History   Grav Para Term Preterm Abortions TAB SAB Ect Mult Living   5 4 4  0 1 0 1 0 1 5     Review of Systems  Constitutional: Negative for fever.  Gastrointestinal: Positive for nausea. Negative for vomiting, abdominal pain, diarrhea, constipation and blood in stool.  Genitourinary: Negative for dysuria, hematuria, vaginal discharge and menstrual  problem.  Musculoskeletal: Positive for back pain. Negative for neck pain and neck stiffness.  Skin: Negative for rash.  Neurological: Positive for light-headedness. Negative for speech difficulty, weakness, numbness and headaches.    Allergies  Review of patient's allergies indicates no known allergies.  Home Medications   Current Outpatient Rx  Name  Route  Sig  Dispense  Refill  . ibuprofen (ADVIL,MOTRIN) 200 MG tablet   Oral   Take 400 mg by mouth every 6 (six) hours as needed (pain).         Marland Kitchen ibuprofen (ADVIL,MOTRIN) 800 MG tablet   Oral   Take 1 tablet (800 mg total) by mouth 3 (three) times daily. Take with food   30 tablet   0   . nitrofurantoin, macrocrystal-monohydrate, (MACROBID) 100 MG capsule   Oral   Take 1 capsule (100 mg total) by mouth 2 (two) times daily. Take with food   10 capsule   0    BP 120/78  Pulse 72  Temp(Src) 98.3 F (36.8 C) (Oral)  Resp 16  Ht 5\' 4"  (1.626 m)  Wt 152 lb (68.947 kg)  BMI 26.08 kg/m2  SpO2 100%  LMP 04/04/2013 Physical Exam  Nursing note and vitals reviewed. Constitutional: She is oriented to person, place, and time. She appears well-developed.  HENT:  Head: Normocephalic and atraumatic.  Eyes: EOM are normal. Pupils are equal, round, and reactive  to light.  Neck: Normal range of motion. Neck supple.  Cardiovascular: Normal rate and regular rhythm.   Pulmonary/Chest: Effort normal and breath sounds normal. She has no wheezes. She has no rales.  Musculoskeletal:       Back:  Midline C-spine, No step-offs, erythremia, ecchymosis, deformities noted.  Decrease ROM secondary to pain.  Moves lower extremities and is able to ambulate without difficulty. No midline C-spine, T-spine, with no step-offs, crepitus, or deformities noted.   Neurological: She is alert and oriented to person, place, and time. She is not disoriented. She displays no atrophy. No cranial nerve deficit or sensory deficit. She exhibits normal muscle  tone. Coordination and gait normal. GCS eye subscore is 4. GCS verbal subscore is 5. GCS motor subscore is 6.  Reflex Scores:      Patellar reflexes are 2+ on the right side and 2+ on the left side. Skin: Skin is warm and dry.  Psychiatric: She has a normal mood and affect. Her behavior is normal.    ED Course  Procedures (including critical care time) Labs Review Labs Reviewed  CBC - Abnormal; Notable for the following:    WBC 10.6 (*)    Hemoglobin 9.1 (*)    HCT 31.6 (*)    MCV 70.4 (*)    MCH 20.3 (*)    MCHC 28.8 (*)    RDW 18.8 (*)    Platelets 447 (*)    All other components within normal limits  URINALYSIS, ROUTINE W REFLEX MICROSCOPIC - Abnormal; Notable for the following:    APPearance CLOUDY (*)    Nitrite POSITIVE (*)    All other components within normal limits  COMPREHENSIVE METABOLIC PANEL - Abnormal; Notable for the following:    Potassium 3.6 (*)    Total Bilirubin 1.4 (*)    GFR calc non Af Amer 86 (*)    All other components within normal limits  URINE MICROSCOPIC-ADD ON - Abnormal; Notable for the following:    Bacteria, UA MANY (*)    All other components within normal limits  URINE CULTURE  PREGNANCY, URINE   Imaging Review Dg Lumbar Spine Complete  04/14/2013   CLINICAL DATA:  Fall with worsening low back pain.  EXAM: LUMBAR SPINE - COMPLETE 4+ VIEW  COMPARISON:  DG ABD 2 VIEWS dated 09/18/2011; CT ABD W/CM dated 10/15/2007  FINDINGS: Minimal S-shaped lumbar spine curvature.  Probable cholecystectomy.  Maintenance of vertebral body height and alignment. Intervertebral disc heights are maintained.  IMPRESSION: Spinal curvature, without acute osseous abnormality.   Electronically Signed   By: Abigail Miyamoto M.D.   On: 04/14/2013 01:18    EKG Interpretation    Date/Time:  Monday April 13 2013 23:49:17 EST Ventricular Rate:  63 PR Interval:  152 QRS Duration: 87 QT Interval:  437 QTC Calculation: 447 R Axis:   85 Text Interpretation:  Sinus rhythm  Consider left ventricular hypertrophy Nonspecific T abnormalities, anterior leads Baseline wander in lead(s) V2 V3 V4 V5 V6 ED PHYSICIAN INTERPRETATION AVAILABLE IN [facility] Confirmed by TEST, RECORD ([id number]) on 04/15/2013 9:57:51 AM            Date: 04/14/2013  Rate: 63    Rhythm: normal sinus rhythm  QRS Axis: normal  Intervals: normal  ST/T Wave abnormalities: nonspecific T wave changes, anterior leads  Conduction Disutrbances:none  Narrative Interpretation: Baseline wander in leas V2-V6, Consider Left ventricular hypertrophy  Old EKG Reviewed: unchanged    MDM   1. Vasovagal near-syncope  2. Anemia   3. Elevated bilirubin   4. UTI (lower urinary tract infection)    Pt presents with a near syncopal event yesterday.  Reports multiple vasovagal near syncopal events in the past 2 weeks. History of anemia will evaluate for electrolyte abnormalities. EKG unchanged since previous tracing.  Slightly elevated Bili level, patient is asymptomatic.  Hbg 9.1 slight elevation from previous values. Negative urine pregnancy, UA- shows UTI, XR- without abnormalities. Discussed lab results, imaging results, and treatment plan with the patient. Return precautions given. Reports understanding and no other concerns at this time.  Patient is stable for discharge at this time.  Meds given in ED:  Medications  ibuprofen (ADVIL,MOTRIN) tablet 800 mg (800 mg Oral Given 04/14/13 0050)    Discharge Medication List as of 04/14/2013  2:15 AM    START taking these medications   Details  !! ibuprofen (ADVIL,MOTRIN) 800 MG tablet Take 1 tablet (800 mg total) by mouth 3 (three) times daily. Take with food, Starting 04/14/2013, Until Discontinued, Print    nitrofurantoin, macrocrystal-monohydrate, (MACROBID) 100 MG capsule Take 1 capsule (100 mg total) by mouth 2 (two) times daily. Take with food, Starting 04/14/2013, Until Discontinued, Print     !! - Potential duplicate medications found. Please  discuss with provider.        Lorrine Kin, PA-C 04/15/13 1328

## 2013-04-13 NOTE — ED Notes (Signed)
Pt states she has been having dizzy spells for about a week  Pt states she fell on Sunday and is c/o back pain  Pt states she feels short of breath and that her heart beat is not regular  Pt states she went to the dr a couple months ago and they told her her blood count was low Pt states she is supposed to be taking iron but it bothered her stomach so she has not been taking it regularly

## 2013-04-14 ENCOUNTER — Emergency Department (HOSPITAL_COMMUNITY): Payer: BC Managed Care – PPO

## 2013-04-14 DIAGNOSIS — R55 Syncope and collapse: Secondary | ICD-10-CM | POA: Diagnosis present

## 2013-04-14 LAB — COMPREHENSIVE METABOLIC PANEL
ALBUMIN: 4 g/dL (ref 3.5–5.2)
ALT: 9 U/L (ref 0–35)
AST: 16 U/L (ref 0–37)
Alkaline Phosphatase: 48 U/L (ref 39–117)
BILIRUBIN TOTAL: 1.4 mg/dL — AB (ref 0.3–1.2)
BUN: 8 mg/dL (ref 6–23)
CALCIUM: 8.8 mg/dL (ref 8.4–10.5)
CO2: 26 mEq/L (ref 19–32)
CREATININE: 0.81 mg/dL (ref 0.50–1.10)
Chloride: 102 mEq/L (ref 96–112)
GFR calc Af Amer: >90 mL/min (ref >90)
GFR calc non Af Amer: 86 mL/min — ABNORMAL LOW (ref >90)
Glucose, Bld: 94 mg/dL (ref 70–99)
Potassium: 3.6 mEq/L — ABNORMAL LOW (ref 3.7–5.3)
Sodium: 139 mEq/L (ref 137–147)
TOTAL PROTEIN: 7.7 g/dL (ref 6.0–8.3)

## 2013-04-14 LAB — URINE MICROSCOPIC-ADD ON

## 2013-04-14 LAB — PREGNANCY, URINE: PREG TEST UR: NEGATIVE

## 2013-04-14 LAB — URINALYSIS, ROUTINE W REFLEX MICROSCOPIC
BILIRUBIN URINE: NEGATIVE
Glucose, UA: NEGATIVE mg/dL
Hgb urine dipstick: NEGATIVE
KETONES UR: NEGATIVE mg/dL
LEUKOCYTES UA: NEGATIVE
NITRITE: POSITIVE — AB
PH: 5.5 (ref 5.0–8.0)
PROTEIN: NEGATIVE mg/dL
Specific Gravity, Urine: 1.014 (ref 1.005–1.030)
UROBILINOGEN UA: 0.2 mg/dL (ref 0.0–1.0)

## 2013-04-14 LAB — CBC
HCT: 31.6 % — ABNORMAL LOW (ref 36.0–46.0)
HEMOGLOBIN: 9.1 g/dL — AB (ref 12.0–15.0)
MCH: 20.3 pg — AB (ref 26.0–34.0)
MCHC: 28.8 g/dL — AB (ref 30.0–36.0)
MCV: 70.4 fL — ABNORMAL LOW (ref 78.0–100.0)
PLATELETS: 447 10*3/uL — AB (ref 150–400)
RBC: 4.49 MIL/uL (ref 3.87–5.11)
RDW: 18.8 % — ABNORMAL HIGH (ref 11.5–15.5)
WBC: 10.6 10*3/uL — ABNORMAL HIGH (ref 4.0–10.5)

## 2013-04-14 MED ORDER — NITROFURANTOIN MONOHYD MACRO 100 MG PO CAPS: 100.0000 mg | ORAL_CAPSULE | Freq: Two times a day (BID) | ORAL | Status: DC

## 2013-04-14 MED ORDER — IBUPROFEN 800 MG PO TABS: 800.0000 mg | ORAL_TABLET | Freq: Three times a day (TID) | ORAL | Status: DC

## 2013-04-14 MED ORDER — IBUPROFEN 800 MG PO TABS
800.0000 mg | ORAL_TABLET | Freq: Once | ORAL | Status: AC
  Administered 2013-04-14: 800 mg via ORAL
  Filled 2013-04-14: qty 1

## 2013-04-14 NOTE — Discharge Instructions (Signed)
Call for a follow up appointment with a Family or Primary Care Provider.  °Return if Symptoms worsen.   °Take medication as prescribed.  ° °

## 2013-04-15 NOTE — ED Provider Notes (Signed)
Medical screening examination/treatment/procedure(s) were performed by non-physician practitioner and as supervising physician I was immediately available for consultation/collaboration.  EKG Interpretation    Date/Time:  Monday April 13 2013 23:49:17 EST Ventricular Rate:  63 PR Interval:  152 QRS Duration: 87 QT Interval:  437 QTC Calculation: 447 R Axis:   85 Text Interpretation:  Sinus rhythm Consider left ventricular hypertrophy Nonspecific T abnormalities, anterior leads Baseline wander in lead(s) V2 V3 V4 V5 V6 ED PHYSICIAN INTERPRETATION AVAILABLE IN [facility] Confirmed by TEST, RECORD ([id number]) on 04/15/2013 9:57:51 AM              Osvaldo Shipper, MD 04/15/13 1610

## 2013-04-16 LAB — URINE CULTURE

## 2013-04-24 ENCOUNTER — Encounter (HOSPITAL_COMMUNITY): Payer: Self-pay | Admitting: Emergency Medicine

## 2013-04-24 ENCOUNTER — Emergency Department (HOSPITAL_COMMUNITY)
Admission: EM | Admit: 2013-04-24 | Discharge: 2013-04-24 | Disposition: A | Discharge status: Home / Self Care | Payer: BC Managed Care – PPO | Source: Home / Self Care | Attending: Family Medicine | Admitting: Family Medicine

## 2013-04-24 DIAGNOSIS — J069 Acute upper respiratory infection, unspecified: Secondary | ICD-10-CM

## 2013-04-24 MED ORDER — GUAIFENESIN ER 600 MG PO TB12: 600.0000 mg | ORAL_TABLET | Freq: Two times a day (BID) | ORAL | Status: DC

## 2013-04-24 MED ORDER — IPRATROPIUM BROMIDE 0.06 % NA SOLN: 2.0000 | Freq: Four times a day (QID) | NASAL | Status: DC

## 2013-04-24 MED ORDER — GUAIFENESIN-CODEINE 100-10 MG/5ML PO SYRP: 10.0000 mL | ORAL_SOLUTION | Freq: Four times a day (QID) | ORAL | Status: DC | PRN

## 2013-04-24 NOTE — ED Notes (Signed)
Patient reports head and chest congestion, discolored phlegm and blowing discolored secretions, sometimes blood tinged. Intermittent feels feverish.  No sore throat.  Does have a cough

## 2013-04-24 NOTE — ED Provider Notes (Signed)
CSN: [id number]     Arrival date & time 04/24/13  1406 History   First MD Initiated Contact with Patient 04/24/13 1446     Chief Complaint  Patient presents with  . Nasal Congestion   (Consider location/radiation/quality/duration/timing/severity/associated sxs/prior Treatment) Patient is a 48 y.o. female presenting with URI. The history is provided by the patient.  URI Presenting symptoms: congestion, cough and rhinorrhea   Presenting symptoms: no fever and no sore throat   Severity:  Mild Onset quality:  Gradual Duration:  1 week Progression:  Improving Chronicity:  New Associated symptoms: no wheezing   Risk factors: sick contacts     Past Medical History  Diagnosis Date  . Anemia   . Gastric ulcer    Past Surgical History  Procedure Laterality Date  . Cholecystectomy    . Breast enhancement surgery     Family History  Problem Relation Age of Onset  . Hypertension Other   . Cancer Other   . Hyperlipidemia Other   . Hypertension Mother   . Hypertension Father   . Cancer Maternal Grandfather    History  Substance Use Topics  . Smoking status: Never Smoker   . Smokeless tobacco: Not on file  . Alcohol Use: No   OB History   Grav Para Term Preterm Abortions TAB SAB Ect Mult Living   5 4 4  0 1 0 1 0 1 5     Review of Systems  Constitutional: Negative.  Negative for fever.  HENT: Positive for congestion, nosebleeds, postnasal drip and rhinorrhea. Negative for sore throat.   Respiratory: Positive for cough. Negative for shortness of breath and wheezing.   Cardiovascular: Negative.   Gastrointestinal: Negative.     Allergies  Review of patient's allergies indicates no known allergies.  Home Medications   Current Outpatient Rx  Name  Route  Sig  Dispense  Refill  . guaiFENesin (MUCINEX) 600 MG 12 hr tablet   Oral   Take by mouth 2 (two) times daily.         Marland Kitchen guaiFENesin (MUCINEX) 600 MG 12 hr tablet   Oral   Take 1 tablet (600 mg total) by mouth 2  (two) times daily.   30 tablet   1   . guaiFENesin-codeine (ROBITUSSIN AC) 100-10 MG/5ML syrup   Oral   Take 10 mLs by mouth 4 (four) times daily as needed for cough.   180 mL   1   . ibuprofen (ADVIL,MOTRIN) 200 MG tablet   Oral   Take 400 mg by mouth every 6 (six) hours as needed (pain).         Marland Kitchen ibuprofen (ADVIL,MOTRIN) 800 MG tablet   Oral   Take 1 tablet (800 mg total) by mouth 3 (three) times daily. Take with food   30 tablet   0   . ipratropium (ATROVENT) 0.06 % nasal spray   Nasal   Place 2 sprays into the nose 4 (four) times daily.   15 mL   1   . nitrofurantoin, macrocrystal-monohydrate, (MACROBID) 100 MG capsule   Oral   Take 1 capsule (100 mg total) by mouth 2 (two) times daily. Take with food   10 capsule   0    BP 101/61  Pulse 81  Temp(Src) 98.8 F (37.1 C) (Oral)  Resp 18  SpO2 100%  LMP 04/04/2013 Physical Exam  Nursing note and vitals reviewed. Constitutional: She is oriented to person, place, and time. She appears well-developed and well-nourished.  HENT:  Head: Normocephalic.  Right Ear: External ear normal.  Left Ear: External ear normal.  Nose: Nose normal.  Mouth/Throat: Oropharynx is clear and moist.  Eyes: Conjunctivae are normal. Pupils are equal, round, and reactive to light.  Neck: Normal range of motion. Neck supple.  Cardiovascular: Normal rate, regular rhythm, normal heart sounds and intact distal pulses.   Pulmonary/Chest: Effort normal and breath sounds normal.  Neurological: She is alert and oriented to person, place, and time.  Skin: Skin is warm and dry.    ED Course  Procedures (including critical care time) Labs Review Labs Reviewed - No data to display Imaging Review No results found.    MDM      Billy Fischer, MD 04/24/13 [phone]

## 2013-04-24 NOTE — Discharge Instructions (Signed)
Drink plenty of fluids as discussed, use medicine as prescribed, and mucinex or delsym for cough. Return or see your doctor if further problems °

## 2013-10-21 ENCOUNTER — Telehealth: Payer: Self-pay

## 2013-10-22 NOTE — Telephone Encounter (Signed)
error 

## 2014-01-25 ENCOUNTER — Encounter (HOSPITAL_COMMUNITY): Payer: Self-pay | Admitting: Emergency Medicine

## 2014-03-22 ENCOUNTER — Encounter: Payer: Self-pay | Admitting: *Deleted

## 2014-08-12 ENCOUNTER — Ambulatory Visit: Payer: No Typology Code available for payment source | Admitting: Certified Nurse Midwife

## 2014-08-15 ENCOUNTER — Ambulatory Visit (INDEPENDENT_AMBULATORY_CARE_PROVIDER_SITE_OTHER): Payer: BLUE CROSS/BLUE SHIELD | Admitting: Emergency Medicine

## 2014-08-15 VITALS — BP 126/68 | HR 80 | Temp 99.1°F | Resp 16 | Ht 64.0 in | Wt 148.8 lb

## 2014-08-15 DIAGNOSIS — M25512 Pain in left shoulder: Secondary | ICD-10-CM

## 2014-08-15 DIAGNOSIS — M5412 Radiculopathy, cervical region: Secondary | ICD-10-CM

## 2014-08-15 MED ORDER — NAPROXEN SODIUM 550 MG PO TABS: 550.0000 mg | ORAL_TABLET | Freq: Two times a day (BID) | ORAL | Status: DC

## 2014-08-15 MED ORDER — CYCLOBENZAPRINE HCL 10 MG PO TABS: 10.0000 mg | ORAL_TABLET | Freq: Three times a day (TID) | ORAL | Status: DC | PRN

## 2014-08-15 NOTE — Patient Instructions (Signed)

## 2014-08-15 NOTE — Progress Notes (Signed)
Subjective:  Patient ID: Gabrielle Hines, female    DOB: [birth year]  Age: 49 y.o. MRN: [id number]  CC: Arm Pain   HPI Gabrielle Hines presents for dilation of pain in her left arm. She has 2 month history of neck pain and posterior shoulder pain with radiation down her left arm. She has no history of injury. She has paresthesias are worse at night in her fingertips of her left hand. Weakness in her left hand as well. And decreased strength of flexion of her elbow. She has tried a number of prescription medications without relief for pain. And has now come in requesting that we do radiographic imaging. She denies any medical hernia medical problems  History Gabrielle Hines has a past medical history of Anemia and Gastric ulcer.   She has past surgical history that includes Cholecystectomy and Breast enhancement surgery.   Her family history includes Cancer in her maternal grandfather and other; Hyperlipidemia in her other; Hypertension in her father, mother, and other.She reports that she has never smoked. She does not have any smokeless tobacco history on file. She reports that she does not drink alcohol or use illicit drugs.  Outpatient Prescriptions Prior to Visit  Medication Sig Dispense Refill  . guaiFENesin (MUCINEX) 600 MG 12 hr tablet Take by mouth 2 (two) times daily.    Marland Kitchen guaiFENesin (MUCINEX) 600 MG 12 hr tablet Take 1 tablet (600 mg total) by mouth 2 (two) times daily. (Patient not taking: Reported on 08/15/2014) 30 tablet 1  . guaiFENesin-codeine (ROBITUSSIN AC) 100-10 MG/5ML syrup Take 10 mLs by mouth 4 (four) times daily as needed for cough. (Patient not taking: Reported on 08/15/2014) 180 mL 1  . ibuprofen (ADVIL,MOTRIN) 200 MG tablet Take 400 mg by mouth every 6 (six) hours as needed (pain).    Marland Kitchen ibuprofen (ADVIL,MOTRIN) 800 MG tablet Take 1 tablet (800 mg total) by mouth 3 (three) times daily. Take with food (Patient not taking: Reported on 08/15/2014) 30 tablet 0  . ipratropium (ATROVENT)  0.06 % nasal spray Place 2 sprays into the nose 4 (four) times daily. (Patient not taking: Reported on 08/15/2014) 15 mL 1  . nitrofurantoin, macrocrystal-monohydrate, (MACROBID) 100 MG capsule Take 1 capsule (100 mg total) by mouth 2 (two) times daily. Take with food (Patient not taking: Reported on 08/15/2014) 10 capsule 0   No facility-administered medications prior to visit.    ROS Review of Systems  Constitutional: Negative for fever, chills and appetite change.  HENT: Negative for congestion, ear pain, postnasal drip, sinus pressure and sore throat.   Eyes: Negative for pain and redness.  Respiratory: Negative for cough, shortness of breath and wheezing.   Cardiovascular: Negative for leg swelling.  Gastrointestinal: Negative for nausea, vomiting, abdominal pain, diarrhea, constipation and blood in stool.  Endocrine: Negative for polyuria.  Genitourinary: Negative for dysuria, urgency, frequency and flank pain.  Musculoskeletal: Positive for neck pain and neck stiffness. Negative for gait problem.  Skin: Negative for rash.  Neurological: Positive for weakness. Negative for headaches.  Psychiatric/Behavioral: Negative for confusion and decreased concentration. The patient is not nervous/anxious.     Objective:  BP 126/68 mmHg  Pulse 80  Temp(Src) 99.1 F (37.3 C) (Oral)  Resp 16  Ht 5\' 4"  (1.626 m)  Wt 148 lb 12.8 oz (67.495 kg)  BMI 25.53 kg/m2  SpO2 98%  LMP 08/11/2014  Physical Exam  Constitutional: She is oriented to person, place, and time. She appears well-developed and well-nourished. No distress.  HENT:  Head: Normocephalic and atraumatic.  Right Ear: External ear normal.  Left Ear: External ear normal.  Nose: Nose normal.  Eyes: Conjunctivae and EOM are normal. Pupils are equal, round, and reactive to light. No scleral icterus.  Neck: Normal range of motion. Neck supple. No tracheal deviation present.  Cardiovascular: Normal rate, regular rhythm and normal heart  sounds.   Pulmonary/Chest: Effort normal. No respiratory distress. She has no wheezes. She has no rales.  Abdominal: She exhibits no mass. There is no tenderness. There is no rebound and no guarding.  Musculoskeletal: She exhibits no edema.       Right shoulder: She exhibits decreased strength.  Lymphadenopathy:    She has no cervical adenopathy.  Neurological: She is alert and oriented to person, place, and time. She exhibits abnormal muscle tone. Coordination normal.  Skin: Skin is warm and dry. No rash noted.  Psychiatric: She has a normal mood and affect. Her behavior is normal.      Assessment & Plan:   Gabrielle Hines was seen today for arm pain.  Diagnoses and all orders for this visit:  Cervical neuritis Orders: -     MR Cervical Spine Wo Contrast; Future  Pain in joint, shoulder region, left  Other orders -     naproxen sodium (ANAPROX DS) 550 MG tablet; Take 1 tablet (550 mg total) by mouth 2 (two) times daily with a meal. -     cyclobenzaprine (FLEXERIL) 10 MG tablet; Take 1 tablet (10 mg total) by mouth 3 (three) times daily as needed for muscle spasms.   I am having Gabrielle Hines start on naproxen sodium and cyclobenzaprine. I am also having her maintain her ibuprofen, nitrofurantoin (macrocrystal-monohydrate), ibuprofen, guaiFENesin, ipratropium, guaiFENesin-codeine, guaiFENesin, cyclobenzaprine, and methylPREDNISolone.  Meds ordered this encounter  Medications  . cyclobenzaprine (FEXMID) 7.5 MG tablet    Sig: Take 7.5 mg by mouth 3 (three) times daily as needed for muscle spasms.  . methylPREDNISolone (MEDROL) 2 MG tablet    Sig: Take 2 mg by mouth daily.  . naproxen sodium (ANAPROX DS) 550 MG tablet    Sig: Take 1 tablet (550 mg total) by mouth 2 (two) times daily with a meal.    Dispense:  40 tablet    Refill:  0  . cyclobenzaprine (FLEXERIL) 10 MG tablet    Sig: Take 1 tablet (10 mg total) by mouth 3 (three) times daily as needed for muscle spasms.    Dispense:  30  tablet    Refill:  0     Follow-up: Return in about 2 weeks (around 08/29/2014).  Roselee Culver, MD

## 2014-08-24 ENCOUNTER — Telehealth: Payer: Self-pay

## 2014-08-24 NOTE — Telephone Encounter (Signed)
Pt states her employer needs her return to work note to state whether or not she can return to work with or without restrictions. Please fax this information to [phone]

## 2014-08-25 NOTE — Telephone Encounter (Signed)
Does she need a follow up visit first?

## 2014-08-26 ENCOUNTER — Ambulatory Visit (INDEPENDENT_AMBULATORY_CARE_PROVIDER_SITE_OTHER): Payer: BLUE CROSS/BLUE SHIELD | Admitting: Certified Nurse Midwife

## 2014-08-26 ENCOUNTER — Encounter: Payer: Self-pay | Admitting: Certified Nurse Midwife

## 2014-08-26 VITALS — BP 102/66 | HR 89 | Temp 99.8°F | Ht 64.0 in | Wt 150.0 lb

## 2014-08-26 DIAGNOSIS — Z01419 Encounter for gynecological examination (general) (routine) without abnormal findings: Secondary | ICD-10-CM

## 2014-08-26 DIAGNOSIS — R011 Cardiac murmur, unspecified: Secondary | ICD-10-CM | POA: Diagnosis not present

## 2014-08-26 DIAGNOSIS — Z9189 Other specified personal risk factors, not elsewhere classified: Secondary | ICD-10-CM

## 2014-08-26 DIAGNOSIS — Z113 Encounter for screening for infections with a predominantly sexual mode of transmission: Secondary | ICD-10-CM | POA: Diagnosis not present

## 2014-08-26 DIAGNOSIS — Z1389 Encounter for screening for other disorder: Secondary | ICD-10-CM

## 2014-08-26 DIAGNOSIS — N939 Abnormal uterine and vaginal bleeding, unspecified: Secondary | ICD-10-CM

## 2014-08-26 DIAGNOSIS — D6489 Other specified anemias: Secondary | ICD-10-CM | POA: Diagnosis not present

## 2014-08-26 NOTE — Telephone Encounter (Signed)
Patient's supervisor is calling because they received the fax letter but it doesn't indicate if the patient can work with or without restrictions. Patient is returning to work Architectural technologist. Please call Neoma Laming!  [place]

## 2014-08-26 NOTE — Progress Notes (Signed)
Patient ID: Gabrielle Hines, female   DOB: [birth year], 49 y.o.   MRN: [id number]    Subjective:      Gabrielle Hines is a 49 y.o. female here for a routine exam.  Current complaints: none.  No chronic medical conditions.  Employed full time.  Sexually active.  Had BTL about 20 years ago.    Desires STD screening.  Does have abnormally long periods with heavy bleeding and occasional clots.    Personal health questionnaire:  Is patient Ashkenazi Jewish, have a family history of breast and/or ovarian cancer: no Is there a family history of uterine cancer diagnosed at age < 7, gastrointestinal cancer, urinary tract cancer, family member who is a Field seismologist syndrome-associated carrier: no Is the patient overweight and hypertensive, family history of diabetes, personal history of gestational diabetes, preeclampsia or PCOS: no Is patient over 2, have PCOS,  family history of premature CHD under age 18, diabetes, smoke, have hypertension or peripheral artery disease:  no At any time, has a partner hit, kicked or otherwise hurt or frightened you?: no Over the past 2 weeks, have you felt down, depressed or hopeless?: no Over the past 2 weeks, have you felt little interest or pleasure in doing things?:no   Gynecologic History Patient's last menstrual period was 08/11/2014. Contraception: tubal ligation Last Pap: unknown. Results were: normal according to the patient Last mammogram: 03/04/13. Results were: normal  Obstetric History OB History  Gravida Para Term Preterm AB SAB TAB Ectopic Multiple Living  5 4 4  0 1 1 0 0 1 5    # Outcome Date GA Lbr Len/2nd Weight Sex Delivery Anes PTL Lv  5A Term 11/14/90 [redacted]w[redacted]d  4 lb 9 oz (2.07 kg) M Vag-Spont EPI  Y  5B Term 11/14/90 [redacted]w[redacted]d  5 lb 6 oz (2.438 kg) F Vag-Spont EPI  Y  4 Term 12/04/89 [redacted]w[redacted]d  6 lb 11 oz (3.033 kg) F Vag-Spont EPI  Y  3 Term 01/11/89 [redacted]w[redacted]d  7 lb 8 oz (3.402 kg) F Vag-Spont EPI  Y  2 Term 09/09/86 [redacted]w[redacted]d  8 lb 14 oz (4.026 kg) F Vag-Spont  EPI  Y  1 SAB 1986 [redacted]w[redacted]d             Past Medical History  Diagnosis Date  . Anemia   . Gastric ulcer     Past Surgical History  Procedure Laterality Date  . Cholecystectomy    . Breast enhancement surgery       Current outpatient prescriptions:  .  ibuprofen (ADVIL,MOTRIN) 200 MG tablet, Take 400 mg by mouth every 6 (six) hours as needed (pain)., Disp: , Rfl:  No Known Allergies  History  Substance Use Topics  . Smoking status: Never Smoker   . Smokeless tobacco: Not on file  . Alcohol Use: No    Family History  Problem Relation Age of Onset  . Hypertension Other   . Cancer Other   . Hyperlipidemia Other   . Hypertension Mother   . Hypertension Father   . Cancer Maternal Grandfather       Review of Systems  Constitutional: negative for fatigue and weight loss Respiratory: negative for cough and wheezing Cardiovascular: negative for chest pain, fatigue and palpitations Gastrointestinal: negative for abdominal pain and change in bowel habits Musculoskeletal:negative for myalgias Neurological: negative for gait problems and tremors Behavioral/Psych: negative for abusive relationship, depression Endocrine: negative for temperature intolerance   Genitourinary:negative for abnormal menstrual periods, genital lesions, hot flashes, sexual problems  and vaginal discharge Integument/breast: negative for breast lump, breast tenderness, nipple discharge and skin lesion(s)    Objective:       BP 102/66 mmHg  Pulse 89  Temp(Src) 99.8 F (37.7 C)  Ht 5\' 4"  (1.626 m)  Wt 150 lb (68.04 kg)  BMI 25.73 kg/m2  LMP 08/11/2014 General:   alert  Skin:   no rash. + abnormal nevi  Lungs:   clear to auscultation bilaterally  Heart:   regular rate and rhythm, S1, S2 present, + murmur  Breasts:   normal without suspicious masses, skin or nipple changes or axillary nodes. +bilateral breast implants  Abdomen:  normal findings: no organomegaly, soft, non-tender and no hernia   Pelvis:  External genitalia: normal general appearance Urinary system: urethral meatus normal and bladder without fullness, nontender Vaginal: normal without tenderness, induration or masses Cervix: normal appearance Adnexa: normal bimanual exam Uterus: anteverted and non-tender, normal size   Lab Review Urine pregnancy test Labs reviewed yes Radiologic studies reviewed yes  50% of 30 min visit spent on counseling and coordination of care.   Assessment:    Healthy female exam.   Cardiac murmur grade 3 Abnormal nevi Anemia AUB   Plan:    Education reviewed: depression evaluation, low fat, low cholesterol diet, safe sex/STD prevention, self breast exams, skin cancer screening, smoking cessation and weight bearing exercise. Contraception: tubal ligation. Mammogram ordered. Follow up in: 1 year.   No orders of the defined types were placed in this encounter.   Orders Placed This Encounter  Procedures  . US Transvaginal Non-OB    Standing Status: Future     Number of Occurrences:      Standing Expiration Date: 10/26/2015    Order Specific Question:  Reason for Exam (SYMPTOM  OR DIAGNOSIS REQUIRED)    Answer:  AUB    Order Specific Question:  Preferred imaging location?    Answer:  [facility]  . MM DIGITAL SCREENING W/ IMPLANTS BILATERAL    Standing Status: Future     Number of Occurrences:      Standing Expiration Date: 10/26/2015    Order Specific Question:  Reason for Exam (SYMPTOM  OR DIAGNOSIS REQUIRED)    Answer:  Annual exam    Order Specific Question:  Is the patient pregnant?    Answer:  No    Order Specific Question:  Preferred imaging location?    Answer:  [facility]  . CBC with Differential/Platelet  . IBC panel  . Ferritin  . Comprehensive metabolic panel  . TSH  . HIV antibody (with reflex)  . Hepatitis B surface antigen  . RPR  . Hepatitis C antibody  . Ambulatory referral to Dermatology    Referral Priority:  Routine    Referral Type:   Consultation    Referral Reason:  Specialty Services Required    Requested Specialty:  Dermatology    Number of Visits Requested:  1  . Ambulatory referral to Cardiology    Referral Priority:  Routine    Referral Type:  Consultation    Referral Reason:  Specialty Services Required    Requested Specialty:  Cardiology    Number of Visits Requested:  1

## 2014-08-27 ENCOUNTER — Ambulatory Visit
Admission: RE | Admit: 2014-08-27 | Discharge: 2014-08-27 | Disposition: A | Discharge status: Home / Self Care | Payer: BLUE CROSS/BLUE SHIELD | Source: Ambulatory Visit | Attending: Emergency Medicine | Admitting: Emergency Medicine

## 2014-08-27 DIAGNOSIS — M5412 Radiculopathy, cervical region: Secondary | ICD-10-CM

## 2014-08-27 LAB — CBC WITH DIFFERENTIAL/PLATELET
BASOS ABS: 0.1 10*3/uL (ref 0.0–0.1)
BASOS PCT: 1 % (ref 0–1)
Eosinophils Absolute: 0.1 10*3/uL (ref 0.0–0.7)
Eosinophils Relative: 2 % (ref 0–5)
HCT: 25.2 % — ABNORMAL LOW (ref 36.0–46.0)
Hemoglobin: 7.1 g/dL — ABNORMAL LOW (ref 12.0–15.0)
LYMPHS PCT: 39 % (ref 12–46)
Lymphs Abs: 2.2 10*3/uL (ref 0.7–4.0)
MCH: 18 pg — AB (ref 26.0–34.0)
MCHC: 28.2 g/dL — ABNORMAL LOW (ref 30.0–36.0)
MCV: 63.8 fL — AB (ref 78.0–100.0)
MONOS PCT: 9 % (ref 3–12)
MPV: 9.2 fL (ref 8.6–12.4)
Monocytes Absolute: 0.5 10*3/uL (ref 0.1–1.0)
NEUTROS ABS: 2.8 10*3/uL (ref 1.7–7.7)
NEUTROS PCT: 49 % (ref 43–77)
PLATELETS: 310 10*3/uL (ref 150–400)
RBC: 3.95 MIL/uL (ref 3.87–5.11)
RDW: 18 % — ABNORMAL HIGH (ref 11.5–15.5)
WBC: 5.7 10*3/uL (ref 4.0–10.5)

## 2014-08-27 LAB — HEPATITIS C ANTIBODY: HCV Ab: NEGATIVE

## 2014-08-27 LAB — HIV ANTIBODY (ROUTINE TESTING W REFLEX): HIV 1&2 Ab, 4th Generation: NONREACTIVE

## 2014-08-27 LAB — HEPATITIS B SURFACE ANTIGEN: Hepatitis B Surface Ag: NEGATIVE

## 2014-08-27 LAB — RPR

## 2014-08-27 LAB — TSH: TSH: 0.617 u[IU]/mL (ref 0.350–4.500)

## 2014-08-27 LAB — FERRITIN: Ferritin: 3 ng/mL — ABNORMAL LOW (ref 10–291)

## 2014-08-28 ENCOUNTER — Other Ambulatory Visit: Payer: Self-pay | Admitting: Emergency Medicine

## 2014-08-28 DIAGNOSIS — M5412 Radiculopathy, cervical region: Secondary | ICD-10-CM

## 2014-08-28 LAB — PAP IG AND HPV HIGH-RISK: HPV DNA High Risk: NOT DETECTED

## 2014-08-30 LAB — IRON: Iron: 16 ug/dL — ABNORMAL LOW (ref 42–145)

## 2014-08-30 LAB — COMPREHENSIVE METABOLIC PANEL
ALBUMIN: 4 g/dL (ref 3.5–5.2)
ALK PHOS: 46 U/L (ref 39–117)
ALT: 8 U/L (ref 0–35)
AST: 14 U/L (ref 0–37)
BILIRUBIN TOTAL: 1.3 mg/dL — AB (ref 0.2–1.2)
BUN: 5 mg/dL — AB (ref 6–23)
CO2: 25 meq/L (ref 19–32)
Calcium: 8.7 mg/dL (ref 8.4–10.5)
Chloride: 105 mEq/L (ref 96–112)
Creat: 0.7 mg/dL (ref 0.50–1.10)
Glucose, Bld: 86 mg/dL (ref 70–99)
Potassium: 4.1 mEq/L (ref 3.5–5.3)
Sodium: 138 mEq/L (ref 135–145)
Total Protein: 6.8 g/dL (ref 6.0–8.3)

## 2014-08-30 LAB — IBC PANEL
%SAT: 3 % — AB (ref 20–55)
TIBC: 474 ug/dL — AB (ref 250–470)
UIBC: 458 ug/dL — ABNORMAL HIGH (ref 125–400)

## 2014-08-31 ENCOUNTER — Other Ambulatory Visit: Payer: Self-pay | Admitting: Certified Nurse Midwife

## 2014-08-31 DIAGNOSIS — D508 Other iron deficiency anemias: Secondary | ICD-10-CM

## 2014-08-31 MED ORDER — FERIVA 21/7 75-1 MG PO TABS: 1.0000 | ORAL_TABLET | Freq: Every day | ORAL | Status: DC

## 2014-09-01 ENCOUNTER — Telehealth: Payer: Self-pay

## 2014-09-01 NOTE — Telephone Encounter (Signed)
Gabrielle Hines - Pt would like to know her MRI results [phone]

## 2014-09-01 NOTE — Telephone Encounter (Signed)
Refer to Neuro. Please advise on what to tell pt.

## 2014-09-02 NOTE — Telephone Encounter (Signed)
MRI showed no acute changes. Showed some chronic changes causing narrowing of canal and bone marrow consistent with her anemia.  Once has appointment with neurology, they can go over her MRI in more detail

## 2014-09-02 NOTE — Telephone Encounter (Signed)
Gave pt message.

## 2014-09-09 ENCOUNTER — Encounter: Payer: Self-pay | Admitting: Physician Assistant

## 2014-09-14 ENCOUNTER — Other Ambulatory Visit: Payer: BLUE CROSS/BLUE SHIELD

## 2014-09-15 ENCOUNTER — Ambulatory Visit: Payer: BLUE CROSS/BLUE SHIELD | Admitting: Cardiology

## 2014-09-16 ENCOUNTER — Ambulatory Visit (INDEPENDENT_AMBULATORY_CARE_PROVIDER_SITE_OTHER): Payer: BLUE CROSS/BLUE SHIELD

## 2014-09-16 ENCOUNTER — Encounter: Payer: Self-pay | Admitting: Obstetrics

## 2014-09-16 ENCOUNTER — Ambulatory Visit (INDEPENDENT_AMBULATORY_CARE_PROVIDER_SITE_OTHER): Payer: BLUE CROSS/BLUE SHIELD | Admitting: Certified Nurse Midwife

## 2014-09-16 ENCOUNTER — Ambulatory Visit: Payer: BLUE CROSS/BLUE SHIELD | Admitting: Certified Nurse Midwife

## 2014-09-16 VITALS — BP 107/69 | HR 88 | Temp 98.9°F | Wt 149.0 lb

## 2014-09-16 DIAGNOSIS — N939 Abnormal uterine and vaginal bleeding, unspecified: Secondary | ICD-10-CM | POA: Diagnosis not present

## 2014-09-16 DIAGNOSIS — N941 Dyspareunia: Secondary | ICD-10-CM

## 2014-09-16 DIAGNOSIS — D251 Intramural leiomyoma of uterus: Secondary | ICD-10-CM

## 2014-09-16 DIAGNOSIS — IMO0002 Reserved for concepts with insufficient information to code with codable children: Secondary | ICD-10-CM

## 2014-09-16 NOTE — Progress Notes (Signed)
Patient presents for U/S results.

## 2014-09-20 ENCOUNTER — Other Ambulatory Visit: Payer: Self-pay | Admitting: Internal Medicine

## 2014-09-20 DIAGNOSIS — R1012 Left upper quadrant pain: Secondary | ICD-10-CM

## 2014-10-04 ENCOUNTER — Encounter: Payer: BLUE CROSS/BLUE SHIELD | Admitting: Cardiology

## 2014-10-04 ENCOUNTER — Encounter: Payer: Self-pay | Admitting: Cardiology

## 2014-10-04 NOTE — Progress Notes (Signed)
No show  This encounter was created in error - please disregard.

## 2014-10-06 ENCOUNTER — Other Ambulatory Visit: Payer: Self-pay | Admitting: Certified Nurse Midwife

## 2015-04-07 ENCOUNTER — Emergency Department (HOSPITAL_COMMUNITY)
Admission: EM | Admit: 2015-04-07 | Discharge: 2015-04-07 | Disposition: A | Discharge status: Home / Self Care | Payer: BLUE CROSS/BLUE SHIELD | Attending: Emergency Medicine | Admitting: Emergency Medicine

## 2015-04-07 ENCOUNTER — Encounter (HOSPITAL_COMMUNITY): Payer: Self-pay | Admitting: Emergency Medicine

## 2015-04-07 DIAGNOSIS — R52 Pain, unspecified: Secondary | ICD-10-CM | POA: Diagnosis not present

## 2015-04-07 DIAGNOSIS — R05 Cough: Secondary | ICD-10-CM | POA: Insufficient documentation

## 2015-04-07 DIAGNOSIS — R110 Nausea: Secondary | ICD-10-CM | POA: Diagnosis not present

## 2015-04-07 DIAGNOSIS — R509 Fever, unspecified: Secondary | ICD-10-CM | POA: Diagnosis present

## 2015-04-07 DIAGNOSIS — R0981 Nasal congestion: Secondary | ICD-10-CM | POA: Insufficient documentation

## 2015-04-07 MED ORDER — ACETAMINOPHEN 325 MG PO TABS
650.0000 mg | ORAL_TABLET | Freq: Once | ORAL | Status: AC | PRN
  Administered 2015-04-07: 650 mg via ORAL
  Filled 2015-04-07: qty 2

## 2015-04-07 NOTE — ED Notes (Signed)
Pt is c/o fever, congestion, cough, body aches, and nausea  Pt states her sxs started on Wednesday

## 2015-04-07 NOTE — ED Notes (Signed)
Pt states she took theraflu at 2100

## 2015-04-08 ENCOUNTER — Emergency Department (HOSPITAL_COMMUNITY)
Admission: EM | Admit: 2015-04-08 | Discharge: 2015-04-08 | Disposition: A | Discharge status: Home / Self Care | Payer: BLUE CROSS/BLUE SHIELD | Attending: Emergency Medicine | Admitting: Emergency Medicine

## 2015-04-08 ENCOUNTER — Encounter (HOSPITAL_COMMUNITY): Payer: Self-pay

## 2015-04-08 ENCOUNTER — Emergency Department (HOSPITAL_COMMUNITY): Payer: BLUE CROSS/BLUE SHIELD

## 2015-04-08 DIAGNOSIS — R55 Syncope and collapse: Secondary | ICD-10-CM

## 2015-04-08 DIAGNOSIS — J069 Acute upper respiratory infection, unspecified: Secondary | ICD-10-CM | POA: Diagnosis not present

## 2015-04-08 DIAGNOSIS — Z7689 Persons encountering health services in other specified circumstances: Secondary | ICD-10-CM

## 2015-04-08 DIAGNOSIS — R05 Cough: Secondary | ICD-10-CM | POA: Diagnosis present

## 2015-04-08 DIAGNOSIS — Z5189 Encounter for other specified aftercare: Secondary | ICD-10-CM

## 2015-04-08 DIAGNOSIS — D649 Anemia, unspecified: Secondary | ICD-10-CM | POA: Insufficient documentation

## 2015-04-08 DIAGNOSIS — Z8711 Personal history of peptic ulcer disease: Secondary | ICD-10-CM | POA: Insufficient documentation

## 2015-04-08 DIAGNOSIS — Z8742 Personal history of other diseases of the female genital tract: Secondary | ICD-10-CM | POA: Insufficient documentation

## 2015-04-08 DIAGNOSIS — D508 Other iron deficiency anemias: Secondary | ICD-10-CM

## 2015-04-08 LAB — BASIC METABOLIC PANEL
Anion gap: 8 (ref 5–15)
CO2: 24 mmol/L (ref 22–32)
Calcium: 8.9 mg/dL (ref 8.9–10.3)
Chloride: 107 mmol/L (ref 101–111)
Creatinine, Ser: 0.73 mg/dL (ref 0.44–1.00)
GFR calc Af Amer: >60 mL/min (ref >60)
Glucose, Bld: 122 mg/dL — ABNORMAL HIGH (ref 65–99)
POTASSIUM: 4.1 mmol/L (ref 3.5–5.1)
SODIUM: 139 mmol/L (ref 135–145)

## 2015-04-08 LAB — CBC WITH DIFFERENTIAL/PLATELET
Basophils Absolute: 0.1 10*3/uL (ref 0.0–0.1)
Basophils Relative: 1 %
EOS ABS: 0.1 10*3/uL (ref 0.0–0.7)
EOS PCT: 1 %
HCT: 24.1 % — ABNORMAL LOW (ref 36.0–46.0)
Hemoglobin: 6.3 g/dL — CL (ref 12.0–15.0)
Lymphocytes Relative: 22 %
Lymphs Abs: 1.1 10*3/uL (ref 0.7–4.0)
MCH: 16.2 pg — AB (ref 26.0–34.0)
MCHC: 26.1 g/dL — ABNORMAL LOW (ref 30.0–36.0)
MCV: 61.8 fL — AB (ref 78.0–100.0)
MONO ABS: 0.5 10*3/uL (ref 0.1–1.0)
Monocytes Relative: 9 %
NEUTROS PCT: 67 %
Neutro Abs: 3.4 10*3/uL (ref 1.7–7.7)
PLATELETS: 386 10*3/uL (ref 150–400)
RBC: 3.9 MIL/uL (ref 3.87–5.11)
RDW: 21.2 % — AB (ref 11.5–15.5)
WBC: 5.2 10*3/uL (ref 4.0–10.5)

## 2015-04-08 LAB — URINALYSIS, ROUTINE W REFLEX MICROSCOPIC
BILIRUBIN URINE: NEGATIVE
Glucose, UA: NEGATIVE mg/dL
HGB URINE DIPSTICK: NEGATIVE
Ketones, ur: NEGATIVE mg/dL
Leukocytes, UA: NEGATIVE
Nitrite: NEGATIVE
PROTEIN: NEGATIVE mg/dL
Specific Gravity, Urine: 1.021 (ref 1.005–1.030)
pH: 6.5 (ref 5.0–8.0)

## 2015-04-08 LAB — PREPARE RBC (CROSSMATCH)

## 2015-04-08 LAB — ABO/RH: ABO/RH(D): O POS

## 2015-04-08 LAB — POC OCCULT BLOOD, ED: Fecal Occult Bld: NEGATIVE

## 2015-04-08 MED ORDER — SODIUM CHLORIDE 0.9 % IV SOLN: Freq: Once | INTRAVENOUS | Status: DC

## 2015-04-08 MED ORDER — FERIVA 21/7 75-1 MG PO TABS: 1.0000 | ORAL_TABLET | Freq: Every day | ORAL | Status: DC

## 2015-04-08 MED ORDER — SODIUM CHLORIDE 0.9 % IV BOLUS (SEPSIS)
1000.0000 mL | Freq: Once | INTRAVENOUS | Status: AC
  Administered 2015-04-08: 1000 mL via INTRAVENOUS

## 2015-04-08 MED ORDER — IBUPROFEN 800 MG PO TABS
800.0000 mg | ORAL_TABLET | Freq: Once | ORAL | Status: DC
  Filled 2015-04-08: qty 1

## 2015-04-08 MED ORDER — ONDANSETRON 4 MG PO TBDP
8.0000 mg | ORAL_TABLET | Freq: Once | ORAL | Status: AC
  Administered 2015-04-08: 8 mg via ORAL
  Filled 2015-04-08: qty 2

## 2015-04-08 NOTE — ED Notes (Signed)
Patient complains of lightheadedness, shortness of breath and dizziness.

## 2015-04-08 NOTE — ED Provider Notes (Signed)
CSN: [id number]     Arrival date & time 04/08/15  [id number] History   First MD Initiated Contact with Patient 04/08/15 [phone]     Chief Complaint  Patient presents with  . Cough  . Generalized Body Aches     (Consider location/radiation/quality/duration/timing/severity/associated sxs/prior Treatment) HPI   This is a 50 year old female who presents emergency Department with chief complaint of symptoms of URI and weakness. She has a past medical history of peptic ulcer, chronic anemia, previous need for blood transfusion, and abnormal uterine bleeding. The patient states that over the past 3 days she has had cough and active of clear sputum, fever with a MAXIMUM TEMPERATURE of 103.1 at home. She complains of associated myalgias, weakness, lightheadedness, shortness of breath and feeling as if she is going to pass out. She denies any recent blood loss, melena, hematochezia, she denies vomiting or diarrhea. She has used over-the-counter Mucinex with little relief of her symptoms. The patient did present to the emergency department yesterday but states that the wait was too long and she left before evaluation. Past Medical History  Diagnosis Date  . Gastric ulcer   . History of anemia 1985  . History of blood transfusion 1983  . Abnormal uterine bleeding (AUB)    Past Surgical History  Procedure Laterality Date  . Breast enhancement surgery  2002  . Cholecystectomy      Age 34   Family History  Problem Relation Age of Onset  . Hypertension Other   . Cancer Other   . Hyperlipidemia Other   . Hypertension Mother   . Heart disease Mother   . Hyperlipidemia Mother   . Hearing loss Father   . Hyperlipidemia Father   . Cancer Maternal Grandfather    Social History  Substance Use Topics  . Smoking status: Never Smoker   . Smokeless tobacco: None  . Alcohol Use: No   OB History    Gravida Para Term Preterm AB TAB SAB Ectopic Multiple Living   5 4 4  0 1 0 1 0 1 5     Review of  Systems  Ten systems reviewed and are negative for acute change, except as noted in the HPI.    Allergies  Review of patient's allergies indicates no known allergies.  Home Medications   Prior to Admission medications   Medication Sig Start Date End Date Taking? Authorizing Provider  dextromethorphan-guaiFENesin (MUCINEX DM) 30-600 MG 12hr tablet Take 1 tablet by mouth 2 (two) times daily as needed for cough.   Yes Historical Provider, MD  ibuprofen (ADVIL,MOTRIN) 200 MG tablet Take 400 mg by mouth every 6 (six) hours as needed (pain).   Yes Historical Provider, MD  Chlorphen-Pseudoephed-APAP (THERAFLU FLU/COLD PO) Take 30 mLs by mouth at bedtime.    Historical Provider, MD  FeAsp-B12-FA-C-DSS-SuccAc-Zn (FERIVA 21/7) 75-1 MG TABS Take 1 tablet by mouth daily. Patient not taking: Reported on 09/16/2014 08/31/14   Rachelle A Denney, CNM   BP 115/71 mmHg  Pulse 95  Temp(Src) 99.9 F (37.7 C) (Oral)  Resp 18  Ht 5\' 4"  (1.626 m)  Wt 68.493 kg  BMI 25.91 kg/m2  SpO2 100%  LMP 03/24/2015 (Exact Date) Physical Exam  Constitutional: She is oriented to person, place, and time. She appears well-developed and well-nourished. No distress.  HENT:  Head: Normocephalic and atraumatic.  Eyes: Conjunctivae are normal. No scleral icterus.  Neck: Normal range of motion.  Cardiovascular: Normal rate, regular rhythm and normal heart sounds.  Exam reveals no gallop  and no friction rub.   No murmur heard. Pulmonary/Chest: Effort normal and breath sounds normal. No respiratory distress.  Abdominal: Soft. Bowel sounds are normal. She exhibits no distension and no mass. There is no tenderness. There is no guarding.  Neurological: She is alert and oriented to person, place, and time.  Skin: Skin is warm and dry. She is not diaphoretic.  Nursing note and vitals reviewed.   ED Course  Procedures (including critical care time) Labs Review Labs Reviewed  CBC WITH DIFFERENTIAL/PLATELET - Abnormal; Notable  for the following:    Hemoglobin 6.3 (*)    HCT 24.1 (*)    MCV 61.8 (*)    MCH 16.2 (*)    MCHC 26.1 (*)    RDW 21.2 (*)    All other components within normal limits  URINALYSIS, ROUTINE W REFLEX MICROSCOPIC (NOT AT [facility]) - Abnormal; Notable for the following:    APPearance HAZY (*)    All other components within normal limits  BASIC METABOLIC PANEL    Imaging Review Dg Chest 2 View  04/08/2015  CLINICAL DATA:  Chest pain, cough, shortness of breath and fever for 2 days. EXAM: CHEST  2 VIEW COMPARISON:  06/12/2012 FINDINGS: The heart size and mediastinal contours are within normal limits. Both lungs are clear. No pleural effusion or pneumothorax. The bony thorax is intact. IMPRESSION: No active cardiopulmonary disease. Electronically Signed   By: Lajean Manes M.D.   On: 04/08/2015 11:27   I have personally reviewed and evaluated these images and lab results as part of my medical decision-making.   EKG Interpretation None      MDM   Final diagnoses:  Symptomatic anemia  Pre-syncope  URI (upper respiratory infection)  Encounter for blood transfusion, without reported diagnosis    Pa here with symptoms of URI. Workup pending.   Patient with hemoglobin of 6.3. Last reading 7 months ago was 7.1. Baseline appears to be around 8 or 9. A question whether the patient has symptoms of weakness secondary to her current URI versus symptomatic anemia. I discussed the case with Dr. Lita Mains who feels that she would likely benefit from one unit of blood. I have ordered a unit for transfusion. Discussed with patient and obtained written consent. Chest x-ray shows no acute abnormalities.  \ Patient transfused one unit of blood here in the emergency department. She states that she is feeling better. She continues to have a slight fever. As the patient to treat her fever to decrease myalgias. Feels she has viral URI. Patient advised to follow up with primary care physician, OB/GYN for further  evaluation of dysfunctional uterine bleeding and I have given the patient hematology consult. She appears safe for discharge at this time. Discussed return precautions with the patient.  Margarita Mail, PA-C 04/08/15 [id number]  Julianne Rice, MD 04/11/15 [phone]

## 2015-04-08 NOTE — Discharge Instructions (Signed)
Upper Respiratory Infection, Adult Most upper respiratory infections (URIs) are a viral infection of the air passages leading to the lungs. A URI affects the nose, throat, and upper air passages. The most common type of URI is nasopharyngitis and is typically referred to as "the common cold." URIs run their course and usually go away on their own. Most of the time, a URI does not require medical attention, but sometimes a bacterial infection in the upper airways can follow a viral infection. This is called a secondary infection. Sinus and middle ear infections are common types of secondary upper respiratory infections. Bacterial pneumonia can also complicate a URI. A URI can worsen asthma and chronic obstructive pulmonary disease (COPD). Sometimes, these complications can require emergency medical care and may be life threatening.  CAUSES Almost all URIs are caused by viruses. A virus is a type of germ and can spread from one person to another.  RISKS FACTORS You may be at risk for a URI if:   You smoke.   You have chronic heart or lung disease.  You have a weakened defense (immune) system.   You are very young or very old.   You have nasal allergies or asthma.  You work in crowded or poorly ventilated areas.  You work in health care facilities or schools. SIGNS AND SYMPTOMS  Symptoms typically develop 2-3 days after you come in contact with a cold virus. Most viral URIs last 7-10 days. However, viral URIs from the influenza virus (flu virus) can last 14-18 days and are typically more severe. Symptoms may include:   Runny or stuffy (congested) nose.   Sneezing.   Cough.   Sore throat.   Headache.   Fatigue.   Fever.   Loss of appetite.   Pain in your forehead, behind your eyes, and over your cheekbones (sinus pain).  Muscle aches.  DIAGNOSIS  Your health care provider may diagnose a URI by:  Physical exam.  Tests to check that your symptoms are not due to  another condition such as:  Strep throat.  Sinusitis.  Pneumonia.  Asthma. TREATMENT  A URI goes away on its own with time. It cannot be cured with medicines, but medicines may be prescribed or recommended to relieve symptoms. Medicines may help:  Reduce your fever.  Reduce your cough.  Relieve nasal congestion. HOME CARE INSTRUCTIONS   Take medicines only as directed by your health care provider.   Gargle warm saltwater or take cough drops to comfort your throat as directed by your health care provider.  Use a warm mist humidifier or inhale steam from a shower to increase air moisture. This may make it easier to breathe.  Drink enough fluid to keep your urine clear or pale yellow.   Eat soups and other clear broths and maintain good nutrition.   Rest as needed.   Return to work when your temperature has returned to normal or as your health care provider advises. You may need to stay home longer to avoid infecting others. You can also use a face mask and careful hand washing to prevent spread of the virus.  Increase the usage of your inhaler if you have asthma.   Do not use any tobacco products, including cigarettes, chewing tobacco, or electronic cigarettes. If you need help quitting, ask your health care provider. PREVENTION  The best way to protect yourself from getting a cold is to practice good hygiene.   Avoid oral or hand contact with people with cold  symptoms.   Wash your hands often if contact occurs.  There is no clear evidence that vitamin C, vitamin E, echinacea, or exercise reduces the chance of developing a cold. However, it is always recommended to get plenty of rest, exercise, and practice good nutrition.  SEEK MEDICAL CARE IF:   You are getting worse rather than better.   Your symptoms are not controlled by medicine.   You have chills.  You have worsening shortness of breath.  You have brown or red mucus.  You have yellow or brown nasal  discharge.  You have pain in your face, especially when you bend forward.  You have a fever.  You have swollen neck glands.  You have pain while swallowing.  You have white areas in the back of your throat. SEEK IMMEDIATE MEDICAL CARE IF:   You have severe or persistent:  Headache.  Ear pain.  Sinus pain.  Chest pain.  You have chronic lung disease and any of the following:  Wheezing.  Prolonged cough.  Coughing up blood.  A change in your usual mucus.  You have a stiff neck.  You have changes in your:  Vision.  Hearing.  Thinking.  Mood. MAKE SURE YOU:   Understand these instructions.  Will watch your condition.  Will get help right away if you are not doing well or get worse.   This information is not intended to replace advice given to you by your health care provider. Make sure you discuss any questions you have with your health care provider.   Document Released: 09/05/2000 Document Revised: 07/27/2014 Document Reviewed: 06/17/2013 Elsevier Interactive Patient Education 2016 [place] Blood Cell Count WHY AM I HAVING THIS TEST? A red blood cell (RBC) count measures the total number of circulating RBCs in a portion of your blood. RBCs are made in your bone marrow and circulate in your bloodstream for about 120 days until they are destroyed by your spleen. These cells are important because they contain hemoglobin, which carries oxygen to body tissues. Having an abnormally low number of RBCs causes anemia. You may have this test as a part of a complete blood count (CBC) test that counts RBCs, white blood cells, and platelets. It may also be done if your health care provider suspects you have certain medical conditions. These may include abnormally low production of red blood cells or abnormally fast destruction of red blood cells (hemolysis). WHAT KIND OF SAMPLE IS TAKEN? A blood sample is required for this test. It is usually collected by  inserting a needle into a vein or by sticking a finger with a small needle. HOW DO I PREPARE FOR THE TEST? There is no preparation required for this test. WHAT ARE THE REFERENCE RANGES? Reference ranges are considered healthy rangesestablished after testing a large group of healthy people. Reference rangesmay vary among different people, labs, and hospitals. It is your responsibility to obtain your test results. Ask the lab or department performing the test when and how you will get your results. RBCs are measured in SI units. A reference range of values for this test is as follows:  Adult or elderly:  Female: 4.7-6.1.  Female: 4.2-5.4.  Children:  Newborn: 4.8-7.1.  2-8 weeks: 4.0-6.0.  2-6 months: 3.5-5.5.  6 months-1 year: 3.5-5.2.  1-6 years: 4.0-5.5.  6-18 years: 4.0-5.5. WHAT DO THE RESULTS MEAN? Test results that are higher than the reference ranges can indicate a number of health conditions. These may include:  Erythrocytosis,  which means excess RBCs.  Heart disease that is present at birth (congenital).  Severe chronic obstructive pulmonary disease (COPD).  Polycythemia vera. This is a disorder of the bone marrow that causes overproduction of RBCs.  Severe dehydration.  Disorders that cause abnormally formed hemoglobin (hemoglobinopathy) or complications from these disorders. Test results that are lower than the reference ranges can indicate a number of health conditions. These may include:  Anemia.  Hemoglobinopathy.  Long-term (chronic) fluid overload due to liver disease. This is also called cirrhosis.  Hemolytic anemia.  Hemorrhage.  Dietary deficiencies such as low iron or vitamin B12 intake.  Bone marrow failure.  Prosthetic heart valves.  Kidney disease.  Pregnancy.  Rheumatoid or collagen vascular diseases such as rheumatoid arthritis, systemic lupus erythematosus, and sarcoidosis.  Cancers such as lymphoma, leukemia, Hodgkin disease,  and multiple myeloma. Talk with your health care provider to discuss your results, treatment options, and if necessary, the need for more tests. Talk with your health care provider if you have any questions about your results.   This information is not intended to replace advice given to you by your health care provider. Make sure you discuss any questions you have with your health care provider.   Document Released: 04/14/2004 Document Revised: 04/02/2014 Document Reviewed: 08/05/2013 Elsevier Interactive Patient Education 2016 [employer].  Near-Syncope Near-syncope (commonly known as near fainting) is sudden weakness, dizziness, or feeling like you might pass out. During an episode of near-syncope, you may also develop pale skin, have tunnel vision, or feel sick to your stomach (nauseous). Near-syncope may occur when getting up after sitting or while standing for a long time. It is caused by a sudden decrease in blood flow to the brain. This decrease can result from various causes or triggers, most of which are not serious. However, because near-syncope can sometimes be a sign of something serious, a medical evaluation is required. The specific cause is often not determined. HOME CARE INSTRUCTIONS  Monitor your condition for any changes. The following actions may help to alleviate any discomfort you are experiencing:  Have someone stay with you until you feel stable.  Lie down right away and prop your feet up if you start feeling like you might faint. Breathe deeply and steadily. Wait until all the symptoms have passed. Most of these episodes last only a few minutes. You may feel tired for several hours.   Drink enough fluids to keep your urine clear or pale yellow.   If you are taking blood pressure or heart medicine, get up slowly when seated or lying down. Take several minutes to sit and then stand. This can reduce dizziness.  Follow up with your health care provider as  directed. SEEK IMMEDIATE MEDICAL CARE IF:   You have a severe headache.   You have unusual pain in the chest, abdomen, or back.   You are bleeding from the mouth or rectum, or you have black or tarry stool.   You have an irregular or very fast heartbeat.   You have repeated fainting or have seizure-like jerking during an episode.   You faint when sitting or lying down.   You have confusion.   You have difficulty walking.   You have severe weakness.   You have vision problems.  MAKE SURE YOU:   Understand these instructions.  Will watch your condition.  Will get help right away if you are not doing well or get worse.   This information is not intended to replace  advice given to you by your health care provider. Make sure you discuss any questions you have with your health care provider.   Document Released: 03/12/2005 Document Revised: 03/17/2013 Document Reviewed: 08/15/2012 Elsevier Interactive Patient Education 2016 [employer].  Iron Deficiency Anemia, Adult Anemia is a condition in which there are less red blood cells or hemoglobin in the blood than normal. Hemoglobin is the part of red blood cells that carries oxygen. Iron deficiency anemia is anemia caused by too little iron. It is the most common type of anemia. It may leave you tired and short of breath. CAUSES   Lack of iron in the diet.  Poor absorption of iron, as seen with intestinal disorders.  Intestinal bleeding.  Heavy periods. SIGNS AND SYMPTOMS  Mild anemia may not be noticeable. Symptoms may include:  Fatigue.  Headache.  Pale skin.  Weakness.  Tiredness.  Shortness of breath.  Dizziness.  Cold hands and feet.  Fast or irregular heartbeat. DIAGNOSIS  Diagnosis requires a thorough evaluation and physical exam by your health care provider. Blood tests are generally used to confirm iron deficiency anemia. Additional tests may be done to find the underlying cause of your  anemia. These may include:  Testing for blood in the stool (fecal occult blood test).  A procedure to see inside the colon and rectum (colonoscopy).  A procedure to see inside the esophagus and stomach (endoscopy). TREATMENT  Iron deficiency anemia is treated by correcting the cause of the deficiency. Treatment may involve:  Adding iron-rich foods to your diet.  Taking iron supplements. Pregnant or breastfeeding women need to take extra iron because their normal diet usually does not provide the required amount.  Taking vitamins. Vitamin C improves the absorption of iron. Your health care provider may recommend that you take your iron tablets with a glass of orange juice or vitamin C supplement.  Medicines to make heavy menstrual flow lighter.  Surgery. HOME CARE INSTRUCTIONS   Take iron as directed by your health care provider.  If you cannot tolerate taking iron supplements by mouth, talk to your health care provider about taking them through a vein (intravenously) or an injection into a muscle.  For the best iron absorption, iron supplements should be taken on an empty stomach. If you cannot tolerate them on an empty stomach, you may need to take them with food.  Do not drink milk or take antacids at the same time as your iron supplements. Milk and antacids may interfere with the absorption of iron.  Iron supplements can cause constipation. Make sure to include fiber in your diet to prevent constipation. A stool softener may also be recommended.  Take vitamins as directed by your health care provider.  Eat a diet rich in iron. Foods high in iron include liver, lean beef, whole-grain bread, eggs, dried fruit, and dark green leafy vegetables. SEEK IMMEDIATE MEDICAL CARE IF:   You faint. If this happens, do not drive. Call your local emergency services (911 in [place] if no other help is available.  You have chest pain.  You feel nauseous or vomit.  You have severe or  increased shortness of breath with activity.  You feel weak.  You have a rapid heartbeat.  You have unexplained sweating.  You become light-headed when getting up from a chair or bed. MAKE SURE YOU:   Understand these instructions.  Will watch your condition.  Will get help right away if you are not doing well or get worse.  This information is not intended to replace advice given to you by your health care provider. Make sure you discuss any questions you have with your health care provider.   Document Released: 03/09/2000 Document Revised: 04/02/2014 Document Reviewed: 11/17/2012 Elsevier Interactive Patient Education 2016 [place].  Blood Transfusion  A blood transfusion is a procedure in which you receive donated blood through an IV tube. You may need a blood transfusion because of illness, surgery, or injury. The blood may come from a donor, or it may be your own blood that you donated previously. The blood given in a transfusion is made up of different types of cells. You may receive:  Red blood cells. These carry oxygen and replace lost blood.  Platelets. These control bleeding.  Plasma. Thishelps blood to clot. If you have hemophilia or another clotting disorder, you may also receive other types of blood products. LET [facility] CARE PROVIDER KNOW ABOUT:  Any allergies you have.  All medicines you are taking, including vitamins, herbs, eye drops, creams, and over-the-counter medicines.  Previous problems you or members of your family have had with the use of anesthetics.  Any blood disorders you have.  Previous surgeries you have had.  Any medical conditions you may have.  Any previous reactions you have had during a blood transfusion.  RISKS AND COMPLICATIONS Generally, this is a safe procedure. However, problems may occur, including:  Having an allergic reaction to something in the donated blood.  Fever. This may be a reaction to the white blood  cells in the transfused blood.  Iron overload. This can happen from having many transfusions.  Transfusion-related acute lung injury (TRALI). This is a rare reaction that causes lung damage. The cause is not known.TRALI can occur within hours of a transfusion or several days later.  Sudden (acute) or delayed hemolytic reactions. This happens if your blood does not match the cells in your transfusion. Your body's defense system (immune system) may try to attack the new cells. This complication is rare.  Infection. This is rare. BEFORE THE PROCEDURE  You may have a blood test to determine your blood type. This is necessary to know what kind of blood your body will accept.  If you are going to have a planned surgery, you may donate your own blood. This may be done in case you need to have a transfusion.  If you have had an allergic reaction to a transfusion in the past, you may be given medicine to help prevent a reaction. Take this medicine only as directed by your health care provider.  You will have your temperature, blood pressure, and pulse monitored before the transfusion. PROCEDURE   An IV will be started in your hand or arm.  The bag of donated blood will be attached to your IV tube and given into your vein.  Your temperature, blood pressure, and pulse will be monitored regularly during the transfusion. This monitoring is done to detect early signs of a transfusion reaction.  If you have any signs or symptoms of a reaction, your transfusion will be stopped and you may be given medicine.  When the transfusion is over, your IV will be removed.  Pressure may be applied to the IV site for a few minutes.  A bandage (dressing) will be applied. The procedure may vary among health care providers and hospitals. AFTER THE PROCEDURE  Your blood pressure, temperature, and pulse will be monitored regularly.   This information is not intended to replace  advice given to you by your  health care provider. Make sure you discuss any questions you have with your health care provider.   Document Released: 03/09/2000 Document Revised: 04/02/2014 Document Reviewed: 01/20/2014 Elsevier Interactive Patient Education 2016 [place].  Blood Transfusion, Care After Refer to this sheet in the next few weeks. These instructions provide you with information about caring for yourself after your procedure. Your health care provider may also give you more specific instructions. Your treatment has been planned according to current medical practices, but problems sometimes occur. Call your health care provider if you have any problems or questions after your procedure. WHAT TO EXPECT AFTER THE PROCEDURE After your procedure, it is common to have:  Bruising and soreness at the IV site.  Chills or fever.  Headache. HOME CARE INSTRUCTIONS  Take medicines only as directed by your health care provider. Ask your health care provider if you can take an over-the-counter pain reliever in case you have a fever or headache a day or two after your transfusion.  Return to your normal activities as directed by your health care provider. SEEK MEDICAL CARE IF:   You develop redness or irritation at your IV site.  You have persistent fever, chills, or headache.  Your urine is darker than normal.  Your urine turns pink, red, or brown.   The white part of your eye turns yellow (jaundice).   You feel weak after doing your normal activities.  SEEK IMMEDIATE MEDICAL CARE IF:   You have trouble breathing.  You have fever and chills along with:  Anxiety.  Chest or back pain.  Flushed skin.  Clammy skin.  A rapid heartbeat.  Nausea.   This information is not intended to replace advice given to you by your health care provider. Make sure you discuss any questions you have with your health care provider.   Document Released: 04/02/2014 Document Reviewed: 04/02/2014 Elsevier  Interactive Patient Education [employer].

## 2015-04-08 NOTE — ED Notes (Signed)
Patient C/O generalized aches and pains that began on Wednesday.  C/O cough, Fever and nausea.  Denies vomiting.  States that she has been taking tylenol and Mucous Relief which helped a little.

## 2015-04-08 NOTE — ED Notes (Signed)
Patient here with 2 days of cough, fever, body aches and nausea. Reports coughing up some yellow sputum.

## 2015-04-08 NOTE — ED Notes (Signed)
Critical Value, HGB - 6.3, Reported to MD.

## 2015-04-09 LAB — TYPE AND SCREEN
ABO/RH(D): O POS
ANTIBODY SCREEN: NEGATIVE
Unit division: 0

## 2016-09-15 IMAGING — DX DG CHEST 2V
2 series · 2 of 2 positions shown · non-contrast
Comparison: 06/12/2012

CLINICAL DATA: Chest pain, cough, shortness of breath and fever for
2 days.

EXAM:
CHEST  2 VIEW

[w chest pa]
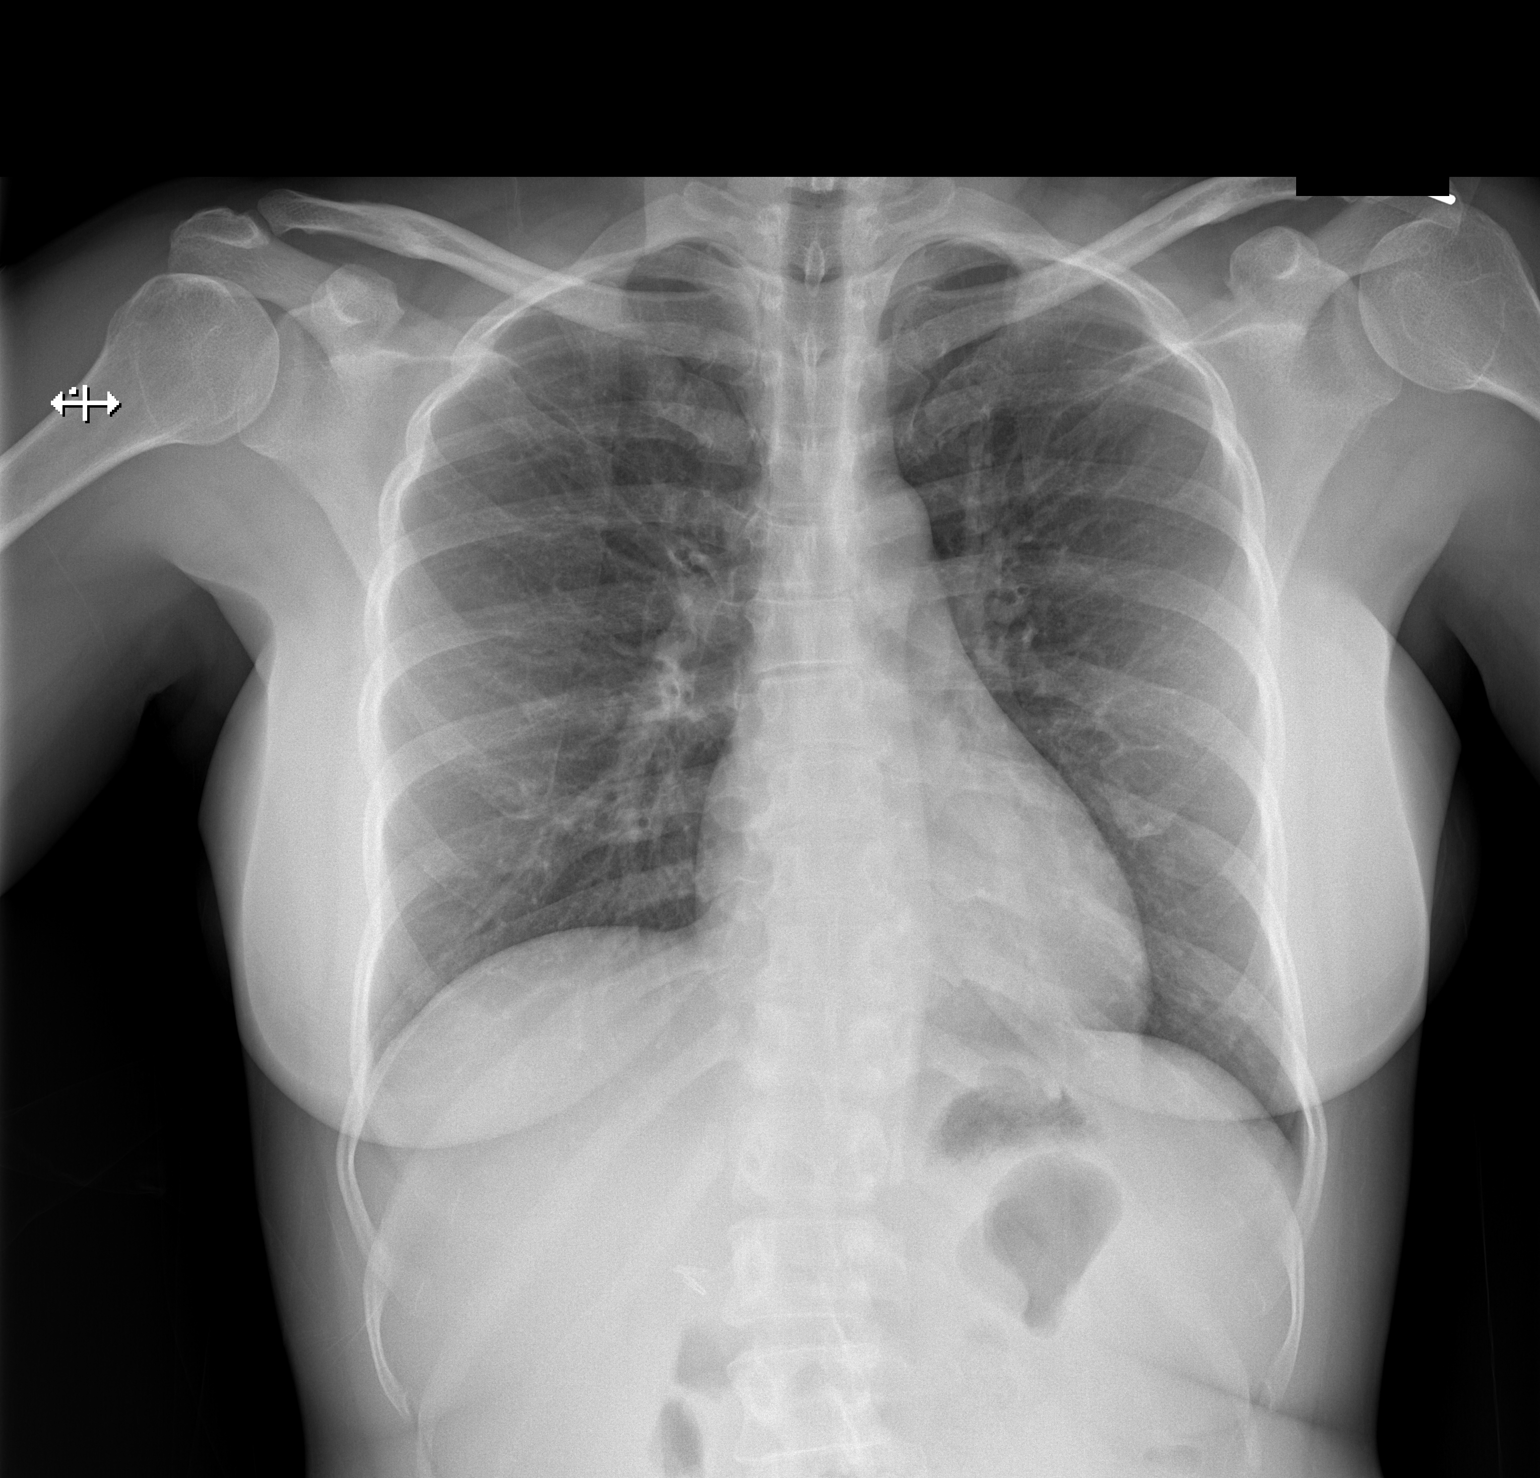

[w chest lat]
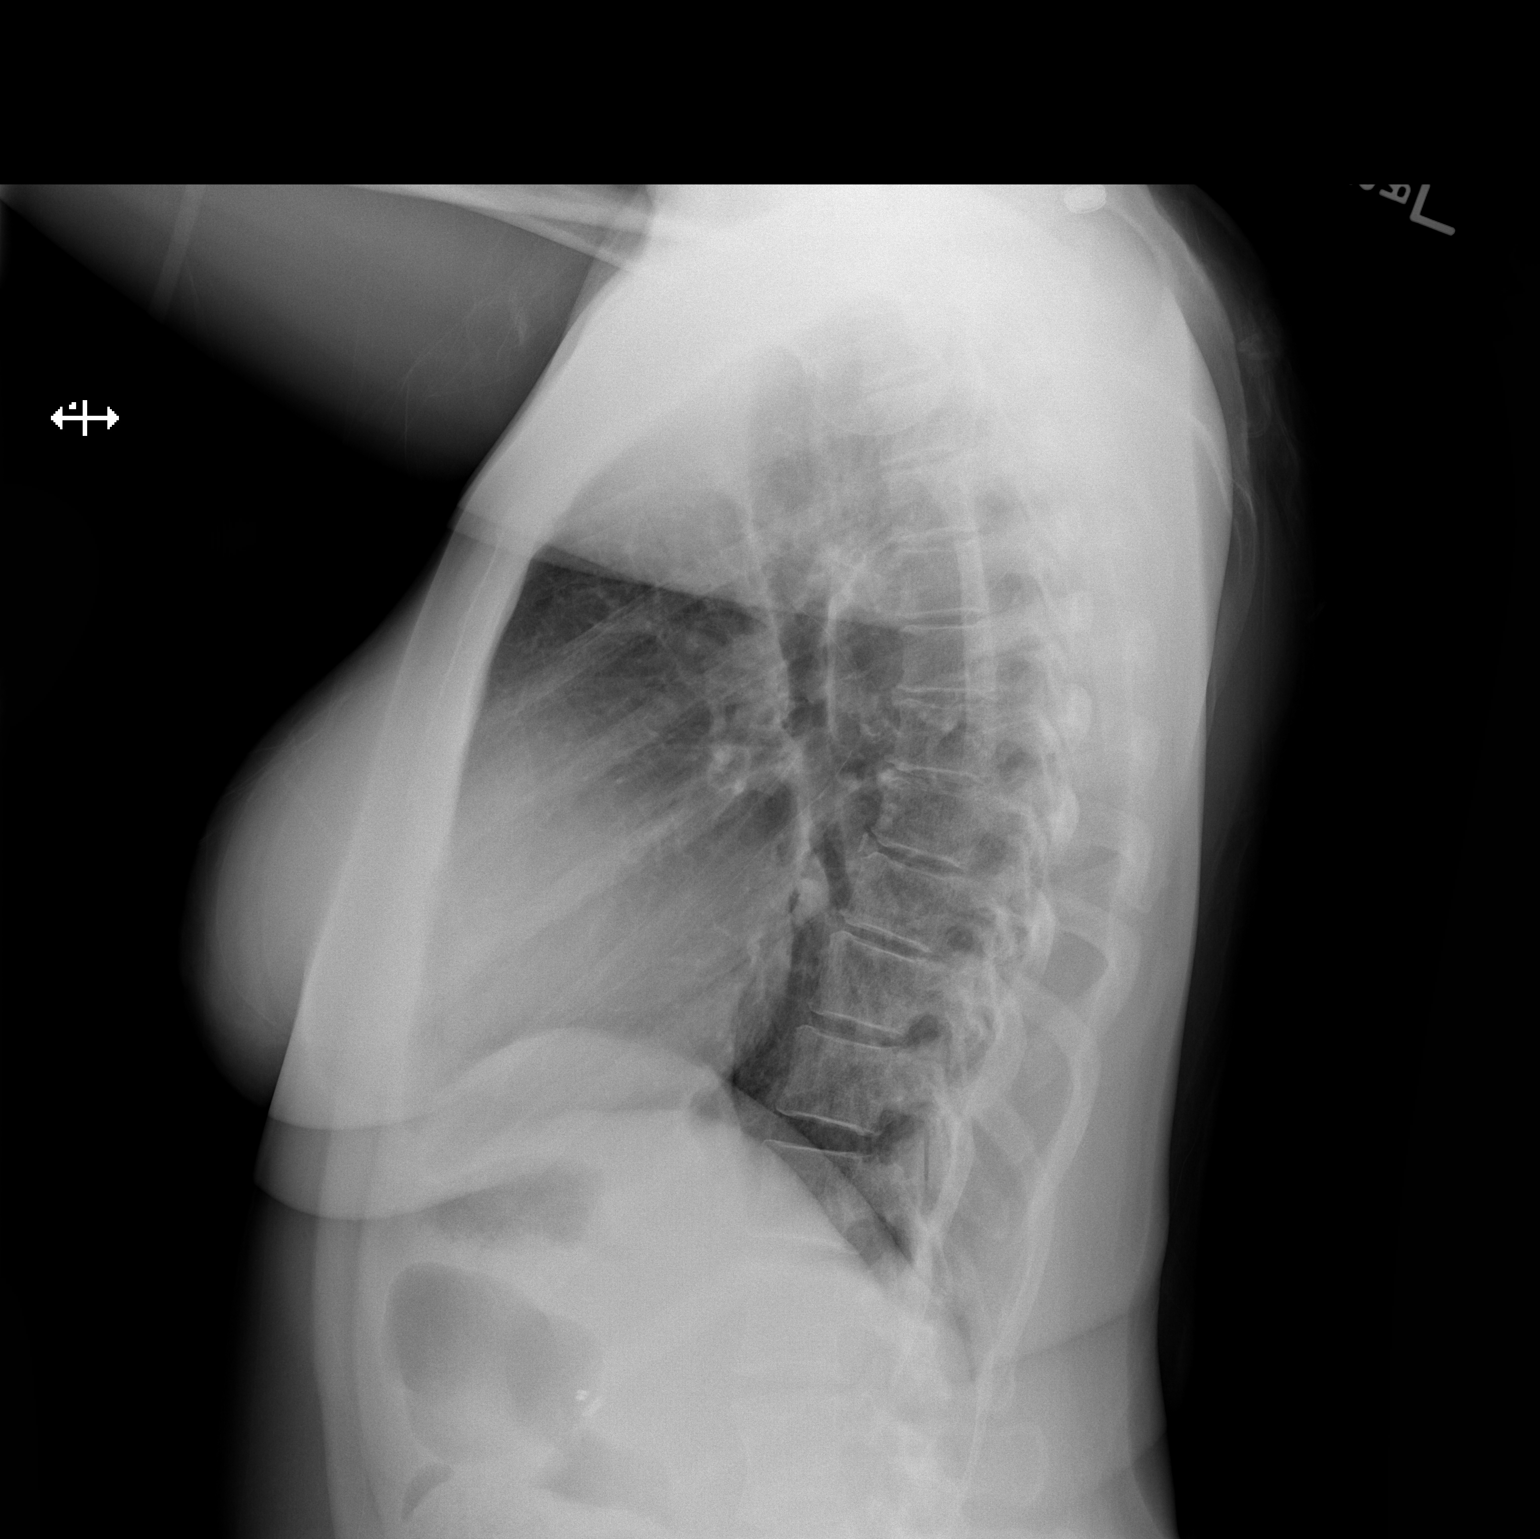

[2 of 2 positions shown; findings below may reference images not displayed]

FINDINGS: The heart size and mediastinal contours are within normal limits.
Both lungs are clear. No pleural effusion or pneumothorax. The bony
thorax is intact.
IMPRESSION: No active cardiopulmonary disease.

## 2017-09-29 ENCOUNTER — Encounter (HOSPITAL_COMMUNITY): Payer: Self-pay | Admitting: *Deleted

## 2017-09-29 ENCOUNTER — Observation Stay (HOSPITAL_COMMUNITY): Payer: Self-pay

## 2017-09-29 ENCOUNTER — Other Ambulatory Visit: Payer: Self-pay

## 2017-09-29 ENCOUNTER — Observation Stay (HOSPITAL_COMMUNITY)
Admission: EM | Admit: 2017-09-29 | Discharge: 2017-09-30 | Disposition: A | Discharge status: Home / Self Care | Payer: Self-pay | Attending: Internal Medicine | Admitting: Internal Medicine

## 2017-09-29 DIAGNOSIS — Z8711 Personal history of peptic ulcer disease: Secondary | ICD-10-CM | POA: Insufficient documentation

## 2017-09-29 DIAGNOSIS — K5900 Constipation, unspecified: Secondary | ICD-10-CM | POA: Insufficient documentation

## 2017-09-29 DIAGNOSIS — R0600 Dyspnea, unspecified: Secondary | ICD-10-CM | POA: Insufficient documentation

## 2017-09-29 DIAGNOSIS — Z79899 Other long term (current) drug therapy: Secondary | ICD-10-CM | POA: Insufficient documentation

## 2017-09-29 DIAGNOSIS — R42 Dizziness and giddiness: Secondary | ICD-10-CM | POA: Insufficient documentation

## 2017-09-29 DIAGNOSIS — D62 Acute posthemorrhagic anemia: Secondary | ICD-10-CM | POA: Insufficient documentation

## 2017-09-29 DIAGNOSIS — N939 Abnormal uterine and vaginal bleeding, unspecified: Secondary | ICD-10-CM | POA: Insufficient documentation

## 2017-09-29 DIAGNOSIS — D509 Iron deficiency anemia, unspecified: Principal | ICD-10-CM | POA: Insufficient documentation

## 2017-09-29 DIAGNOSIS — D649 Anemia, unspecified: Secondary | ICD-10-CM | POA: Diagnosis present

## 2017-09-29 DIAGNOSIS — R109 Unspecified abdominal pain: Secondary | ICD-10-CM | POA: Insufficient documentation

## 2017-09-29 LAB — CBC
HCT: 22.5 % — ABNORMAL LOW (ref 36.0–46.0)
HCT: 28 % — ABNORMAL LOW (ref 36.0–46.0)
Hemoglobin: 5.5 g/dL — CL (ref 12.0–15.0)
Hemoglobin: 7.8 g/dL — ABNORMAL LOW (ref 12.0–15.0)
MCH: 15 pg — ABNORMAL LOW (ref 26.0–34.0)
MCH: 18.6 pg — ABNORMAL LOW (ref 26.0–34.0)
MCHC: 24.4 g/dL — AB (ref 30.0–36.0)
MCHC: 27.9 g/dL — ABNORMAL LOW (ref 30.0–36.0)
MCV: 61.5 fL — ABNORMAL LOW (ref 78.0–100.0)
MCV: 66.8 fL — ABNORMAL LOW (ref 78.0–100.0)
PLATELETS: 402 10*3/uL — AB (ref 150–400)
Platelets: 369 10*3/uL (ref 150–400)
RBC: 3.66 MIL/uL — ABNORMAL LOW (ref 3.87–5.11)
RBC: 4.19 MIL/uL (ref 3.87–5.11)
RDW: 21.1 % — AB (ref 11.5–15.5)
RDW: 23.5 % — ABNORMAL HIGH (ref 11.5–15.5)
WBC: 7.9 10*3/uL (ref 4.0–10.5)
WBC: 11 10*3/uL — AB (ref 4.0–10.5)

## 2017-09-29 LAB — URINALYSIS, ROUTINE W REFLEX MICROSCOPIC
BILIRUBIN URINE: NEGATIVE
Bacteria, UA: NONE SEEN
GLUCOSE, UA: NEGATIVE mg/dL
HGB URINE DIPSTICK: NEGATIVE
KETONES UR: NEGATIVE mg/dL
LEUKOCYTES UA: NEGATIVE
NITRITE: NEGATIVE
PH: 8 (ref 5.0–8.0)
Protein, ur: NEGATIVE mg/dL
SPECIFIC GRAVITY, URINE: 1.003 — AB (ref 1.005–1.030)

## 2017-09-29 LAB — PROTIME-INR
INR: 1.13
PROTHROMBIN TIME: 14.4 s (ref 11.4–15.2)

## 2017-09-29 LAB — COMPREHENSIVE METABOLIC PANEL
ALBUMIN: 3.9 g/dL (ref 3.5–5.0)
ALT: 10 U/L (ref 0–44)
ANION GAP: 6 (ref 5–15)
AST: 14 U/L — ABNORMAL LOW (ref 15–41)
Alkaline Phosphatase: 48 U/L (ref 38–126)
BUN: 7 mg/dL (ref 6–20)
CALCIUM: 8.9 mg/dL (ref 8.9–10.3)
CO2: 26 mmol/L (ref 22–32)
CREATININE: 0.71 mg/dL (ref 0.44–1.00)
Chloride: 108 mmol/L (ref 98–111)
GFR calc non Af Amer: >60 mL/min (ref >60)
GLUCOSE: 115 mg/dL — AB (ref 70–99)
Potassium: 3.6 mmol/L (ref 3.5–5.1)
Sodium: 140 mmol/L (ref 135–145)
TOTAL PROTEIN: 7.9 g/dL (ref 6.5–8.1)
Total Bilirubin: 1.9 mg/dL — ABNORMAL HIGH (ref 0.3–1.2)

## 2017-09-29 LAB — LIPASE, BLOOD: LIPASE: 34 U/L (ref 11–51)

## 2017-09-29 LAB — POC OCCULT BLOOD, ED: FECAL OCCULT BLD: NEGATIVE

## 2017-09-29 LAB — PREPARE RBC (CROSSMATCH)

## 2017-09-29 LAB — I-STAT BETA HCG BLOOD, ED (MC, WL, AP ONLY): I-stat hCG, quantitative: <5 m[IU]/mL (ref <5)

## 2017-09-29 LAB — HIV ANTIBODY (ROUTINE TESTING W REFLEX): HIV Screen 4th Generation wRfx: NONREACTIVE

## 2017-09-29 LAB — ABO/RH: ABO/RH(D): O POS

## 2017-09-29 MED ORDER — PANTOPRAZOLE SODIUM 40 MG PO TBEC
40.0000 mg | DELAYED_RELEASE_TABLET | Freq: Every day | ORAL | Status: DC
  Administered 2017-09-29 – 2017-09-30 (×2): 40 mg via ORAL
  Filled 2017-09-29: qty 1

## 2017-09-29 MED ORDER — GI COCKTAIL ~~LOC~~
30.0000 mL | Freq: Once | ORAL | Status: AC
  Administered 2017-09-29: 30 mL via ORAL
  Filled 2017-09-29: qty 30

## 2017-09-29 MED ORDER — FERROUS SULFATE 325 (65 FE) MG PO TABS
325.0000 mg | ORAL_TABLET | Freq: Every day | ORAL | Status: DC
  Administered 2017-09-30: 325 mg via ORAL
  Filled 2017-09-29: qty 1

## 2017-09-29 MED ORDER — TRAMADOL HCL 50 MG PO TABS
50.0000 mg | ORAL_TABLET | Freq: Four times a day (QID) | ORAL | Status: DC | PRN
  Administered 2017-09-29 (×2): 50 mg via ORAL
  Filled 2017-09-29 (×2): qty 1

## 2017-09-29 MED ORDER — LACTULOSE 10 GM/15ML PO SOLN
10.0000 g | ORAL | Status: AC
  Administered 2017-09-29: 10 g via ORAL
  Filled 2017-09-29: qty 15

## 2017-09-29 MED ORDER — ACETAMINOPHEN 650 MG RE SUPP: 650.0000 mg | Freq: Four times a day (QID) | RECTAL | Status: DC | PRN

## 2017-09-29 MED ORDER — SODIUM CHLORIDE 0.9% FLUSH
3.0000 mL | Freq: Two times a day (BID) | INTRAVENOUS | Status: DC
  Administered 2017-09-29 – 2017-09-30 (×3): 3 mL via INTRAVENOUS

## 2017-09-29 MED ORDER — ACETAMINOPHEN 325 MG PO TABS
650.0000 mg | ORAL_TABLET | Freq: Four times a day (QID) | ORAL | Status: DC | PRN
  Filled 2017-09-29: qty 2

## 2017-09-29 MED ORDER — SODIUM CHLORIDE 0.9 % IV SOLN
510.0000 mg | Freq: Once | INTRAVENOUS | Status: AC
  Administered 2017-09-29: 510 mg via INTRAVENOUS
  Filled 2017-09-29: qty 17

## 2017-09-29 MED ORDER — ALUM & MAG HYDROXIDE-SIMETH 200-200-20 MG/5ML PO SUSP: 15.0000 mL | Freq: Four times a day (QID) | ORAL | Status: DC | PRN

## 2017-09-29 MED ORDER — SODIUM CHLORIDE 0.9 % IV SOLN: 250.0000 mL | INTRAVENOUS | Status: DC | PRN

## 2017-09-29 MED ORDER — PANTOPRAZOLE SODIUM 40 MG IV SOLR
40.0000 mg | Freq: Once | INTRAVENOUS | Status: AC
  Administered 2017-09-29: 40 mg via INTRAVENOUS
  Filled 2017-09-29: qty 40

## 2017-09-29 MED ORDER — SODIUM CHLORIDE 0.9% IV SOLUTION
Freq: Once | INTRAVENOUS | Status: AC
  Administered 2017-09-29: 06:00:00 via INTRAVENOUS

## 2017-09-29 MED ORDER — SODIUM CHLORIDE 0.9% FLUSH: 3.0000 mL | INTRAVENOUS | Status: DC | PRN

## 2017-09-29 MED ORDER — ONDANSETRON HCL 4 MG/2ML IJ SOLN
4.0000 mg | Freq: Four times a day (QID) | INTRAMUSCULAR | Status: DC | PRN
  Administered 2017-09-29: 4 mg via INTRAVENOUS
  Filled 2017-09-29: qty 2

## 2017-09-29 NOTE — ED Notes (Signed)
Bed: WA13 Expected date:  Expected time:  Means of arrival:  Comments: Triage 3 

## 2017-09-29 NOTE — H&P (Signed)
TRH H&P   Patient Demographics:    Gabrielle Hines, is a 52 y.o. female  MRN: [id number]   DOB - 1965/07/23  Admit Date - 09/29/2017  Outpatient Primary MD for the patient is Rennis Golden  Referring MD/NP/PA: Deno Etienne  Outpatient Specialists:   Patient coming from: home  Chief Complaint  Patient presents with  . Abdominal Pain      HPI:    Gabrielle Hines  is a 52 y.o. female, w hx of PUD, anemia, apparently presents with epigastric discomfort starting today as well as dizziness and slight dyspnea on exertion.   In ED,  Urinalysis negative Lipase 34 Na 140, K 3.6, Bun 7, creatinine 0.71 Ast 14, Alt 10  Wbc 7.9, Hgb 5.5, Plt 402  Pt will be admitted for symptomatic anemia        Review of systems:    In addition to the HPI above,  No Fever-chills, No Headache, No changes with Vision or hearing, No problems swallowing food or Liquids, No Chest pain, Cough or Shortness of Breath, No Nausea or Vommitting, Bowel movements are regular, No Blood in stool or Urine, No dysuria, No new skin rashes or bruises, No new joints pains-aches,  No new weakness, tingling, numbness in any extremity, No recent weight gain or loss, No polyuria, polydypsia or polyphagia, No significant Mental Stressors.  A full 10 point Review of Systems was done, except as stated above, all other Review of Systems were negative.   With Past History of the following :    Past Medical History:  Diagnosis Date  . Abnormal uterine bleeding (AUB)   . Gastric ulcer   . History of anemia 1985  . History of blood transfusion 1983      Past Surgical History:  Procedure Laterality Date  . BREAST ENHANCEMENT SURGERY  2002  . CHOLECYSTECTOMY     Age 35      Social History:     Social History   Tobacco Use  . Smoking status: Never Smoker  Substance Use Topics  . Alcohol use:  No    Alcohol/week: 0.0 oz     Lives - at home  Mobility - walks by self   Family History :     Family History  Problem Relation Age of Onset  . Hypertension Mother   . Heart disease Mother   . Hyperlipidemia Mother   . Hearing loss Father   . Hyperlipidemia Father   . Cancer Maternal Grandfather   . Hypertension Other   . Cancer Other   . Hyperlipidemia Other      Home Medications:   Prior to Admission medications   Medication Sig Start Date End Date Taking? Authorizing Provider  Chlorphen-Pseudoephed-APAP (THERAFLU FLU/COLD PO) Take 30 mLs by mouth at bedtime.    [provider]  dextromethorphan-guaiFENesin (MUCINEX DM) 30-600 MG 12hr tablet Take 1  tablet by mouth 2 (two) times daily as needed for cough.    [provider]  FeAsp-B12-FA-C-DSS-SuccAc-Zn (FERIVA 21/7) 75-1 MG TABS Take 1 tablet by mouth daily. 04/08/15   Harris, Vernie Shanks, PA-C  ibuprofen (ADVIL,MOTRIN) 200 MG tablet Take 400 mg by mouth every 6 (six) hours as needed (pain).    [provider]     Allergies:    No Known Allergies   Physical Exam:   Vitals  Blood pressure 118/67, pulse 81, temperature 98.8 F (37.1 C), temperature source Oral, resp. rate 18, height 5\' 4"  (1.626 m), weight 70.7 kg (155 lb 12.8 oz), last menstrual period 09/08/2017, SpO2 100 %.   1. General  lying in bed in NAD,   2. Normal affect and insight, Not Suicidal or Homicidal, Awake Alert, Oriented X 3., pale conjunctiva  3. No F.N deficits, ALL C.Nerves Intact, Strength 5/5 all 4 extremities, Sensation intact all 4 extremities, Plantars down going.  4. Ears and Eyes appear Normal, Conjunctivae clear, PERRLA. Moist Oral Mucosa.  5. Supple Neck, No JVD, No cervical lymphadenopathy appriciated, No Carotid Bruits.  6. Symmetrical Chest wall movement, Good air movement bilaterally, CTAB.  7. RRR, No Gallops, Rubs or Murmurs, No Parasternal Heave.  8. Positive Bowel Sounds, Abdomen Soft, No  tenderness, No organomegaly appriciated,No rebound -guarding or rigidity.  9.  No Cyanosis, Normal Skin Turgor, No Skin Rash or Bruise.  10. Good muscle tone,  joints appear normal , no effusions, Normal ROM.  11. No Palpable Lymph Nodes in Neck or Axillae     Data Review:    CBC Recent Labs  Lab 09/29/17 0212  WBC 7.9  HGB 5.5*  HCT 22.5*  PLT 402*  MCV 61.5*  MCH 15.0*  MCHC 24.4*  RDW 21.1*   ------------------------------------------------------------------------------------------------------------------  Chemistries  Recent Labs  Lab 09/29/17 0212  NA 140  K 3.6  CL 108  CO2 26  GLUCOSE 115*  BUN 7  CREATININE 0.71  CALCIUM 8.9  AST 14*  ALT 10  ALKPHOS 48  BILITOT 1.9*   ------------------------------------------------------------------------------------------------------------------ estimated creatinine clearance is 82.1 mL/min (by C-G formula based on SCr of 0.71 mg/dL). ------------------------------------------------------------------------------------------------------------------ No results for input(s): TSH, T4TOTAL, T3FREE, THYROIDAB in the last 72 hours.  Invalid input(s): FREET3  Coagulation profile No results for input(s): INR, PROTIME in the last 168 hours. ------------------------------------------------------------------------------------------------------------------- No results for input(s): DDIMER in the last 72 hours. -------------------------------------------------------------------------------------------------------------------  Cardiac Enzymes No results for input(s): CKMB, TROPONINI, MYOGLOBIN in the last 168 hours.  Invalid input(s): CK ------------------------------------------------------------------------------------------------------------------ No results found for: BNP   ---------------------------------------------------------------------------------------------------------------  Urinalysis    Component Value  Date/Time   COLORURINE YELLOW 04/08/2015 1110   APPEARANCEUR HAZY (A) 04/08/2015 1110   LABSPEC 1.021 04/08/2015 1110   PHURINE 6.5 04/08/2015 1110   GLUCOSEU NEGATIVE 04/08/2015 1110   HGBUR NEGATIVE 04/08/2015 1110   BILIRUBINUR NEGATIVE 04/08/2015 1110   KETONESUR NEGATIVE 04/08/2015 1110   PROTEINUR NEGATIVE 04/08/2015 1110   UROBILINOGEN 0.2 04/13/2013 2355   NITRITE NEGATIVE 04/08/2015 1110   LEUKOCYTESUR NEGATIVE 04/08/2015 1110    ----------------------------------------------------------------------------------------------------------------   Imaging Results:    No results found.     Assessment & Plan:    Active Problems:   Anemia    Anemia, symptomatic Transfuse 2 units prbc Check cbc after transfusion   Iron deficiency Cont ferrous sulfate on discharge,  If oral is not working consider iv iron  PUD Avoid nsaids PPI  DVT Prophylaxis  SCDs   AM Labs Ordered,  also please review Full Orders  Family Communication: Admission, patients condition and plan of care including tests being ordered have been discussed with the patient  who indicate understanding and agree with the plan and Code Status.  Code Status  FULL CODE  Likely DC to  home  Condition GUARDED    Consults called: none  Admission status:  observation  Time spent in minutes : 50 minutes   Jani Gravel M.D on 09/29/2017 at 3:51 AM  Between 7am to 7pm - Pager - [phone]   After 7pm go to [email or web address] - password [facility]  [facility] - Office  [phone]

## 2017-09-29 NOTE — ED Provider Notes (Signed)
[place] DEPT Provider Note   CSN: [id number] Arrival date & time: 09/29/17  0028     History   Chief Complaint Chief Complaint  Patient presents with  . Abdominal Pain    HPI Gabrielle Hines is a 52 y.o. female.  52 yo F with a chief complaint of abdominal pain.  Is located to her epigastrium.  Described as an ache.  Nothing seems to make this better or worse.  Seems to come and go.  Denies nausea or vomiting denies dark stool or blood in her stool.  She has had some loose stools over the past week or so.  Pain is been there for about the same amount of time.  Denies sick contacts.  Has a history of a bleeding ulcer.  Has had some worsening bleeding with her menstrual cycle over the past few cycles.  The history is provided by the patient.  Abdominal Pain   This is a new problem. The current episode started 2 days ago. The problem occurs constantly. The problem has not changed since onset.The pain is associated with an unknown factor. The pain is located in the epigastric region. The quality of the pain is dull. The pain is at a severity of 8/10. The pain is moderate. Pertinent negatives include fever, nausea, vomiting, dysuria, headaches, arthralgias and myalgias. Nothing aggravates the symptoms. Nothing relieves the symptoms.    Past Medical History:  Diagnosis Date  . Abnormal uterine bleeding (AUB)   . Gastric ulcer   . History of anemia 1985  . History of blood transfusion 1983    Patient Active Problem List   Diagnosis Date Noted  . Vasovagal near-syncope 04/14/2013  . Anemia 02/13/2013    Past Surgical History:  Procedure Laterality Date  . BREAST ENHANCEMENT SURGERY  2002  . CHOLECYSTECTOMY     Age 49     OB History    Gravida  5   Para  4   Term  4   Preterm  0   AB  1   Living  5     SAB  1   TAB  0   Ectopic  0   Multiple  1   Live Births  5            Home Medications    Prior to Admission  medications   Medication Sig Start Date End Date Taking? Authorizing Provider  Chlorphen-Pseudoephed-APAP (THERAFLU FLU/COLD PO) Take 30 mLs by mouth at bedtime.    [provider]  dextromethorphan-guaiFENesin (MUCINEX DM) 30-600 MG 12hr tablet Take 1 tablet by mouth 2 (two) times daily as needed for cough.    [provider]  FeAsp-B12-FA-C-DSS-SuccAc-Zn (FERIVA 21/7) 75-1 MG TABS Take 1 tablet by mouth daily. 04/08/15   Harris, Vernie Shanks, PA-C  ibuprofen (ADVIL,MOTRIN) 200 MG tablet Take 400 mg by mouth every 6 (six) hours as needed (pain).    [provider]    Family History Family History  Problem Relation Age of Onset  . Hypertension Mother   . Heart disease Mother   . Hyperlipidemia Mother   . Hearing loss Father   . Hyperlipidemia Father   . Cancer Maternal Grandfather   . Hypertension Other   . Cancer Other   . Hyperlipidemia Other     Social History Social History   Tobacco Use  . Smoking status: Never Smoker  Substance Use Topics  . Alcohol use: No    Alcohol/week: 0.0 oz  .  Drug use: No     Allergies   Patient has no known allergies.   Review of Systems Review of Systems  Constitutional: Negative for chills and fever.  HENT: Negative for congestion and rhinorrhea.   Eyes: Negative for redness and visual disturbance.  Respiratory: Negative for shortness of breath and wheezing.   Cardiovascular: Negative for chest pain and palpitations.  Gastrointestinal: Positive for abdominal pain. Negative for nausea and vomiting.  Genitourinary: Negative for dysuria and urgency.  Musculoskeletal: Negative for arthralgias and myalgias.  Skin: Negative for pallor and wound.  Neurological: Positive for light-headedness. Negative for dizziness and headaches.     Physical Exam Updated Vital Signs BP 118/67 (BP Location: Left Arm)   Pulse 81   Temp 98.8 F (37.1 C) (Oral)   Resp 18   Ht 5\' 4"  (1.626 m)   Wt 70.7 kg (155 lb 12.8 oz)   LMP  09/08/2017 (Exact Date)   SpO2 100%   BMI 26.74 kg/m   Physical Exam  Constitutional: She is oriented to person, place, and time. She appears well-developed and well-nourished. No distress.  HENT:  Head: Normocephalic and atraumatic.  Eyes: Pupils are equal, round, and reactive to light. EOM are normal.  Neck: Normal range of motion. Neck supple.  Cardiovascular: Normal rate and regular rhythm. Exam reveals no gallop and no friction rub.  No murmur heard. Pulmonary/Chest: Effort normal. She has no wheezes. She has no rales.  Abdominal: Soft. She exhibits no distension. There is tenderness ( mild diffuse).  Genitourinary:  Genitourinary Comments: Soft brown stool heme-negative  Musculoskeletal: She exhibits no edema or tenderness.  Neurological: She is alert and oriented to person, place, and time.  Skin: Skin is warm and dry. She is not diaphoretic.  Psychiatric: She has a normal mood and affect. Her behavior is normal.  Nursing note and vitals reviewed.    ED Treatments / Results  Labs (all labs ordered are listed, but only abnormal results are displayed) Labs Reviewed  COMPREHENSIVE METABOLIC PANEL - Abnormal; Notable for the following components:      Result Value   Glucose, Bld 115 (*)    AST 14 (*)    Total Bilirubin 1.9 (*)    All other components within normal limits  CBC - Abnormal; Notable for the following components:   RBC 3.66 (*)    Hemoglobin 5.5 (*)    HCT 22.5 (*)    MCV 61.5 (*)    MCH 15.0 (*)    MCHC 24.4 (*)    RDW 21.1 (*)    Platelets 402 (*)    All other components within normal limits  LIPASE, BLOOD  URINALYSIS, ROUTINE W REFLEX MICROSCOPIC  PROTIME-INR  HIV ANTIBODY (ROUTINE TESTING)  I-STAT BETA HCG BLOOD, ED ([facility], [facility], [facility] ONLY)  POC OCCULT BLOOD, ED  TYPE AND SCREEN  PREPARE RBC (CROSSMATCH)    EKG EKG Interpretation  Date/Time:  Sunday September 29 2017 03:32:20 EDT Ventricular Rate:  73 PR Interval:    QRS Duration: 94 QT  Interval:  422 QTC Calculation: 465 R Axis:   75 Text Interpretation:  Sinus rhythm Anterior infarct, age indeterminate No significant change since last tracing Confirmed by Deno Etienne [phone]) on 09/29/2017 3:43:48 AM   Radiology No results found.  Procedures Procedures (including critical care time)  Medications Ordered in ED Medications  pantoprazole (PROTONIX) injection 40 mg (has no administration in time range)  gi cocktail (Maalox,Lidocaine,Donnatal) (has no administration in time range)  0.9 %  sodium chloride infusion (Manually program via Guardrails IV Fluids) (has no administration in time range)  sodium chloride flush (NS) 0.9 % injection 3 mL (has no administration in time range)  sodium chloride flush (NS) 0.9 % injection 3 mL (has no administration in time range)  0.9 %  sodium chloride infusion (has no administration in time range)  acetaminophen (TYLENOL) tablet 650 mg (has no administration in time range)    Or  acetaminophen (TYLENOL) suppository 650 mg (has no administration in time range)     Initial Impression / Assessment and Plan / ED Course  I have reviewed the triage vital signs and the nursing notes.  Pertinent labs & imaging results that were available during my care of the patient were reviewed by me and considered in my medical decision making (see chart for details).     52 yo F with a chief complaint of epigastric abdominal pain.  Patient was found to have a hemoglobin of 5.5.  Suspected upper GI bleed.  However her stool is soft and brown.  She has no focal abdominal tenderness on my exam.  Will transfuse 2 units.  I suspect this may be chronic iron deficiency as she has a very low MCV is not tachycardic.  She is symptomatic however and so will discuss with the hospitalist for admission.  CRITICAL CARE Performed by: Cecilio Asper   Total critical care time: 35 minutes  Critical care time was exclusive of separately billable procedures and  treating other patients.  Critical care was necessary to treat or prevent imminent or life-threatening deterioration.  Critical care was time spent personally by me on the following activities: development of treatment plan with patient and/or surrogate as well as nursing, discussions with consultants, evaluation of patient's response to treatment, examination of patient, obtaining history from patient or surrogate, ordering and performing treatments and interventions, ordering and review of laboratory studies, ordering and review of radiographic studies, pulse oximetry and re-evaluation of patient's condition.  The patients results and plan were reviewed and discussed.   Any x-rays performed were independently reviewed by myself.   Differential diagnosis were considered with the presenting HPI.  Medications  pantoprazole (PROTONIX) injection 40 mg (has no administration in time range)  gi cocktail (Maalox,Lidocaine,Donnatal) (has no administration in time range)  0.9 %  sodium chloride infusion (Manually program via Guardrails IV Fluids) (has no administration in time range)  sodium chloride flush (NS) 0.9 % injection 3 mL (has no administration in time range)  sodium chloride flush (NS) 0.9 % injection 3 mL (has no administration in time range)  0.9 %  sodium chloride infusion (has no administration in time range)  acetaminophen (TYLENOL) tablet 650 mg (has no administration in time range)    Or  acetaminophen (TYLENOL) suppository 650 mg (has no administration in time range)    Vitals:   09/29/17 0057  BP: 118/67  Pulse: 81  Resp: 18  Temp: 98.8 F (37.1 C)  TempSrc: Oral  SpO2: 100%  Weight: 70.7 kg (155 lb 12.8 oz)  Height: 5\' 4"  (1.626 m)    Final diagnoses:  Symptomatic anemia    Admission/ observation were discussed with the admitting physician, patient and/or family and they are comfortable with the plan.     Final Clinical Impressions(s) / ED Diagnoses   Final  diagnoses:  Symptomatic anemia    ED Discharge Orders    None       Deno Etienne, DO 09/29/17 2956

## 2017-09-29 NOTE — ED Notes (Signed)
ED TO INPATIENT HANDOFF REPORT  Name/Age/Gender Gabrielle Hines 52 y.o. female  Code Status    Code Status Orders  (From admission, onward)        Start     Ordered   09/29/17 0351  Full code  Continuous     09/29/17 0350    Code Status History    This patient has a current code status but no historical code status.      Home/SNF/Other Home  Chief Complaint abdominal pain  Level of Care/Admitting Diagnosis ED Disposition    ED Disposition Condition Comment   Admit  Hospital Area: [facility] [[id number]]  Level of Care: Telemetry [5]  Admit to tele based on following criteria: Monitor for Ischemic changes  Diagnosis: Anemia [045409]  Admitting Physician: Jani Gravel [[id number]]  Attending Physician: Jani Gravel [[id number]]  PT Class (Do Not Modify): Observation [104]  PT Acc Code (Do Not Modify): Observation [10022]       Medical History Past Medical History:  Diagnosis Date  . Abnormal uterine bleeding (AUB)   . Gastric ulcer   . History of anemia 1985  . History of blood transfusion 1983    Allergies No Known Allergies  IV Location/Drains/Wounds Patient Lines/Drains/Airways Status   Active Line/Drains/Airways    Name:   Placement date:   Placement time:   Site:   Days:   Peripheral IV 09/29/17 Left Antecubital   09/29/17    0400    Antecubital   less than 1   Peripheral IV 09/29/17 Right Antecubital   09/29/17    0337    Antecubital   less than 1          Labs/Imaging Results for orders placed or performed during the hospital encounter of 09/29/17 (from the past 48 hour(s))  Lipase, blood     Status: None   Collection Time: 09/29/17  2:12 AM  Result Value Ref Range   Lipase 34 11 - 51 U/L    Comment: Performed at [facility], [place] [address] [place], [place] [address]  Comprehensive metabolic panel     Status: Abnormal   Collection Time: 09/29/17  2:12 AM  Result Value Ref Range   Sodium 140 135 - 145 mmol/L   Potassium 3.6 3.5 - 5.1 mmol/L   Chloride 108 98 - 111 mmol/L    Comment: Please note change in reference range.   CO2 26 22 - 32 mmol/L   Glucose, Bld 115 (H) 70 - 99 mg/dL    Comment: Please note change in reference range.   BUN 7 6 - 20 mg/dL    Comment: Please note change in reference range.   Creatinine, Ser 0.71 0.44 - 1.00 mg/dL   Calcium 8.9 8.9 - 10.3 mg/dL   Total Protein 7.9 6.5 - 8.1 g/dL   Albumin 3.9 3.5 - 5.0 g/dL   AST 14 (L) 15 - 41 U/L   ALT 10 0 - 44 U/L    Comment: Please note change in reference range.   Alkaline Phosphatase 48 38 - 126 U/L   Total Bilirubin 1.9 (H) 0.3 - 1.2 mg/dL   GFR calc non Af Amer >60 >60 mL/min   GFR calc Af Amer >60 >60 mL/min    Comment: (NOTE) The eGFR has been calculated using the CKD EPI equation. This calculation has not been validated in all clinical situations. eGFR's persistently <60 mL/min signify possible Chronic Kidney Disease.    Anion gap 6 5 -  15    Comment: Performed at [facility], [place] [address] [place], [place] [address]  CBC     Status: Abnormal   Collection Time: 09/29/17  2:12 AM  Result Value Ref Range   WBC 7.9 4.0 - 10.5 K/uL   RBC 3.66 (L) 3.87 - 5.11 MIL/uL   Hemoglobin 5.5 (LL) 12.0 - 15.0 g/dL    Comment: REPEATED TO VERIFY CRITICAL RESULT CALLED TO, READ BACK BY AND VERIFIED WITH: B JESSEE,RN 09/29/17 0250 RHOLMES    HCT 22.5 (L) 36.0 - 46.0 %   MCV 61.5 (L) 78.0 - 100.0 fL   MCH 15.0 (L) 26.0 - 34.0 pg   MCHC 24.4 (L) 30.0 - 36.0 g/dL   RDW 21.1 (H) 11.5 - 15.5 %   Platelets 402 (H) 150 - 400 K/uL    Comment: Performed at [facility], [place] [address], [place], [place] [address]  I-Stat beta hCG blood, ED     Status: None   Collection Time: 09/29/17  2:22 AM  Result Value Ref Range   I-stat hCG, quantitative <5.0 <5 mIU/mL   Comment 3            Comment:   GEST. AGE      CONC.  (mIU/mL)   <=1 WEEK        5 - 50     2 WEEKS       50 - 500     3 WEEKS        100 - 10,000     4 WEEKS     1,000 - 30,000        FEMALE AND NON-PREGNANT FEMALE:     LESS THAN 5 mIU/mL   POC occult blood, ED Provider will collect     Status: None   Collection Time: 09/29/17  3:31 AM  Result Value Ref Range   Fecal Occult Bld NEGATIVE NEGATIVE   No results found.  Pending Labs Unresulted Labs (From admission, onward)   Start     Ordered   09/30/17 0500  Comprehensive metabolic panel  Tomorrow morning,   R     09/29/17 0350   09/30/17 0500  CBC  Tomorrow morning,   R     09/29/17 0350   09/29/17 0349  HIV antibody (Routine Testing)  Once,   R     09/29/17 0350   09/29/17 0331  Prepare RBC  (Adult Blood Administration - Red Blood Cells)  Once,   R    Question Answer Comment  # of Units 2 units   Transfusion Indications Symptomatic Anemia   If emergent release call blood bank Not emergent release   Instructions: Transfuse      09/29/17 0331   09/29/17 0330  Protime-INR  STAT,   STAT     09/29/17 0330   09/29/17 0257  Type and screen [place]  Once,   STAT    Comments:  [place]    09/29/17 0257   09/29/17 0202  Urinalysis, Routine w reflex microscopic  STAT,   STAT     09/29/17 0202      Vitals/Pain Today's Vitals   09/29/17 0057 09/29/17 0201  BP: 118/67   Pulse: 81   Resp: 18   Temp: 98.8 F (37.1 C)   TempSrc: Oral   SpO2: 100%   Weight: 155 lb 12.8 oz (70.7 kg)   Height: '5\' 4"'$  (1.626 m)   PainSc:  3     Isolation Precautions No active isolations  Medications Medications  pantoprazole (PROTONIX) injection 40 mg (has no administration in time range)  gi cocktail (Maalox,Lidocaine,Donnatal) (has no administration in time range)  0.9 %  sodium chloride infusion (Manually program via Guardrails IV Fluids) (has no administration in time range)  sodium chloride flush (NS) 0.9 % injection 3 mL (has no administration in time range)  sodium chloride flush (NS) 0.9 % injection 3 mL (has no  administration in time range)  0.9 %  sodium chloride infusion (has no administration in time range)  acetaminophen (TYLENOL) tablet 650 mg (has no administration in time range)    Or  acetaminophen (TYLENOL) suppository 650 mg (has no administration in time range)    Mobility  Walks

## 2017-09-29 NOTE — ED Triage Notes (Signed)
Pt c/o abd pain x 1 week with diarrhea.  Denies n/v.

## 2017-09-29 NOTE — Progress Notes (Signed)
CMT contacted CN d/t pt experiencing pause 2.9 sec. Pt is asymptomatic, see VS flow sheet. Hospitalist updated, no additional orders except to maintain telemetry.

## 2017-09-29 NOTE — Progress Notes (Signed)
PROGRESS NOTE    Gabrielle Hines  ONG:[id number] DOB: [birth year] DOA: 09/29/2017 PCP: Orlena Sheldon, PA-C    Brief Narrative:  52 y.o. female, w hx of PUD, anemia, apparently presents with epigastric discomfort starting today as well as dizziness and slight dyspnea on exertion    Assessment & Plan:   Principal Problem:   Anemia   1. Acute blood loss anemia with iron deficiency anemia 1. Presenting hgb of 5.5 2. Patient reports hx of heavy periods, most recent was 2 weeks prior hospital visit 3. Stools are heme neg 4. On further questioning, patient reports chronic hx of easy fatigue and subjective sob. Suspecting chronic anemia in the setting of heavy periods 5. Most recent iron study from 2016 reviewed, noted to be low at 16. Patient reports not taking iron supplementation currently 6. Will order one dose of IV iron and continue on scheduled PO iron supplementation 7. 2 units PRBC's ordered, giving now 8. Will repeat CBC in AM 2. Hx heavy menstrual periods 1. No further bleeding per patient 2. Recommend close follow up with [facility] 3. Constipation 1. Likely contributing to generalized abd pain 2. Have ordered abd xray and have reviewed. 3. Findings of stool most notably in the descending colon 4. Will give trial of lactulose 4. Hx ulcers 1. No dark stools, melena, or obvious blood per rectum  DVT prophylaxis: SCD's Code Status: Full Family Communication: Pt in room, family not at bedside Disposition Plan: Possible d/c home in 24hrs if hgb improves and patient remains stable  Consultants:     Procedures:     Antimicrobials: Anti-infectives (From admission, onward)   None       Subjective: Complaining of generalized abd discomfort  Objective: Vitals:   09/29/17 0451 09/29/17 0532 09/29/17 0800 09/29/17 1009  BP: 122/62 106/64 (!) 103/59 107/62  Pulse: 77 71 74 84  Resp: 19 16 16 16   Temp: 98.2 F (36.8 C) 97.9 F (36.6 C) 98 F (36.7 C) 98.1  F (36.7 C)  TempSrc: Oral Tympanic Oral Oral  SpO2: 100% 100% 100% 99%  Weight:      Height:        Intake/Output Summary (Last 24 hours) at 09/29/2017 1433 Last data filed at 09/29/2017 1009 Gross per 24 hour  Intake 500 ml  Output -  Net 500 ml   Filed Weights   09/29/17 0057  Weight: 70.7 kg (155 lb 12.8 oz)    Examination:  General exam: Appears calm and comfortable  Respiratory system: Clear to auscultation. Respiratory effort normal. Cardiovascular system: S1 & S2 heard, RRR Gastrointestinal system: Abdomen mildly distended, decreased BS, generally tender Central nervous system: Alert and oriented. No focal neurological deficits. Extremities: Symmetric 5 x 5 power. Skin: No rashes, lesions Psychiatry: Judgement and insight appear normal. Mood & affect appropriate.   Data Reviewed: I have personally reviewed following labs and imaging studies  CBC: Recent Labs  Lab 09/29/17 0212  WBC 7.9  HGB 5.5*  HCT 22.5*  MCV 61.5*  PLT 440*   Basic Metabolic Panel: Recent Labs  Lab 09/29/17 0212  NA 140  K 3.6  CL 108  CO2 26  GLUCOSE 115*  BUN 7  CREATININE 0.71  CALCIUM 8.9   GFR: Estimated Creatinine Clearance: 82.1 mL/min (by C-G formula based on SCr of 0.71 mg/dL). Liver Function Tests: Recent Labs  Lab 09/29/17 0212  AST 14*  ALT 10  ALKPHOS 48  BILITOT 1.9*  PROT 7.9  ALBUMIN 3.9  Recent Labs  Lab 09/29/17 0212  LIPASE 34   No results for input(s): AMMONIA in the last 168 hours. Coagulation Profile: Recent Labs  Lab 09/29/17 0340  INR 1.13   Cardiac Enzymes: No results for input(s): CKTOTAL, CKMB, CKMBINDEX, TROPONINI in the last 168 hours. BNP (last 3 results) No results for input(s): PROBNP in the last 8760 hours. HbA1C: No results for input(s): HGBA1C in the last 72 hours. CBG: No results for input(s): GLUCAP in the last 168 hours. Lipid Profile: No results for input(s): CHOL, HDL, LDLCALC, TRIG, CHOLHDL, LDLDIRECT in the last  72 hours. Thyroid Function Tests: No results for input(s): TSH, T4TOTAL, FREET4, T3FREE, THYROIDAB in the last 72 hours. Anemia Panel: No results for input(s): VITAMINB12, FOLATE, FERRITIN, TIBC, IRON, RETICCTPCT in the last 72 hours. Sepsis Labs: No results for input(s): PROCALCITON, LATICACIDVEN in the last 168 hours.  No results found for this or any previous visit (from the past 240 hour(s)).   Radiology Studies: Dg Abd Portable 1v  Result Date: 09/29/2017 CLINICAL DATA:  Watery stools.  Recent constipation EXAM: PORTABLE ABDOMEN - 1 VIEW COMPARISON:  None. FINDINGS: There is moderate stool throughout the colon. There is no bowel dilatation or air-fluid level to suggest bowel obstruction. No free air. There are surgical clips in the gallbladder fossa region. There are phleboliths in the pelvis. IMPRESSION: Moderate stool in colon.  No bowel obstruction or free air evident. Electronically Signed   By: Lowella Grip III M.D.   On: 09/29/2017 11:37    Scheduled Meds: . [START ON 09/30/2017] ferrous sulfate  325 mg Oral Q breakfast  . pantoprazole  40 mg Oral Daily  . sodium chloride flush  3 mL Intravenous Q12H   Continuous Infusions: . sodium chloride       LOS: 0 days   Marylu Lund, MD [facility] Pager [phone]  If 7PM-7AM, please contact night-coverage [email or web address] Password [facility] 09/29/2017, 2:33 PM

## 2017-09-30 DIAGNOSIS — D649 Anemia, unspecified: Secondary | ICD-10-CM

## 2017-09-30 DIAGNOSIS — K5900 Constipation, unspecified: Secondary | ICD-10-CM

## 2017-09-30 LAB — TYPE AND SCREEN
ABO/RH(D): O POS
ANTIBODY SCREEN: NEGATIVE
Unit division: 0
Unit division: 0

## 2017-09-30 LAB — COMPREHENSIVE METABOLIC PANEL
ALK PHOS: 43 U/L (ref 38–126)
ALT: 10 U/L (ref 0–44)
AST: 13 U/L — ABNORMAL LOW (ref 15–41)
Albumin: 3.4 g/dL — ABNORMAL LOW (ref 3.5–5.0)
Anion gap: 6 (ref 5–15)
BILIRUBIN TOTAL: 2.1 mg/dL — AB (ref 0.3–1.2)
BUN: 7 mg/dL (ref 6–20)
CALCIUM: 8.4 mg/dL — AB (ref 8.9–10.3)
CO2: 24 mmol/L (ref 22–32)
CREATININE: 0.83 mg/dL (ref 0.44–1.00)
Chloride: 110 mmol/L (ref 98–111)
GFR calc non Af Amer: >60 mL/min (ref >60)
Glucose, Bld: 91 mg/dL (ref 70–99)
Potassium: 4 mmol/L (ref 3.5–5.1)
SODIUM: 140 mmol/L (ref 135–145)
TOTAL PROTEIN: 6.4 g/dL — AB (ref 6.5–8.1)

## 2017-09-30 LAB — BPAM RBC
Blood Product Expiration Date: 201908062359
Blood Product Expiration Date: 201908062359
ISSUE DATE / TIME: 201907070511
ISSUE DATE / TIME: 201907071001
UNIT TYPE AND RH: 5100
Unit Type and Rh: 5100

## 2017-09-30 LAB — CBC
HCT: 28.9 % — ABNORMAL LOW (ref 36.0–46.0)
Hemoglobin: 7.9 g/dL — ABNORMAL LOW (ref 12.0–15.0)
MCH: 18.6 pg — AB (ref 26.0–34.0)
MCHC: 27.3 g/dL — AB (ref 30.0–36.0)
MCV: 68 fL — ABNORMAL LOW (ref 78.0–100.0)
PLATELETS: 325 10*3/uL (ref 150–400)
RBC: 4.25 MIL/uL (ref 3.87–5.11)
RDW: 23.5 % — ABNORMAL HIGH (ref 11.5–15.5)
WBC: 7.6 10*3/uL (ref 4.0–10.5)

## 2017-09-30 MED ORDER — DOCUSATE SODIUM 100 MG PO CAPS: 100.0000 mg | ORAL_CAPSULE | Freq: Two times a day (BID) | ORAL | 0 refills | Status: AC

## 2017-09-30 MED ORDER — FERROUS SULFATE 325 (65 FE) MG PO TABS: 325.0000 mg | ORAL_TABLET | Freq: Every day | ORAL | 0 refills | Status: AC

## 2017-09-30 MED ORDER — POLYETHYLENE GLYCOL 3350 17 G PO PACK: 17.0000 g | PACK | Freq: Every day | ORAL | 0 refills | Status: DC

## 2017-09-30 MED ORDER — SENNA 8.6 MG PO TABS: 1.0000 | ORAL_TABLET | Freq: Every day | ORAL | 0 refills | Status: AC | PRN

## 2017-09-30 NOTE — Discharge Summary (Signed)
Physician Discharge Summary  Gabrielle Hines:[id number] DOB: January 04, 1966 DOA: 09/29/2017  PCP: Orlena Sheldon, PA-C  Admit date: 09/29/2017 Discharge date: 09/30/2017  Admitted From: Home Disposition:  Home  Recommendations for Outpatient Follow-up:  1. Follow up with PCP in 1-2 weeks 2. Recommend follow up at [facility] to address history of heavy menstrual bleeding 3. Recommend repeat CBC in 1-2 weeks  Discharge Condition:Improved CODE STATUS:Full Diet recommendation: Regular   Brief/Interim Summary: 52 y.o.female,w hx of PUD, anemia, apparently presents with epigastric discomfort starting today as well as dizziness and slight dyspnea on exertion  1. Acute blood loss anemia with iron deficiency anemia 1. Presenting hgb of 5.5 2. Patient reports hx of heavy periods, most recent was 2 weeks prior hospital visit 3. Stools are heme neg 4. On further questioning, patient reports chronic hx of easy fatigue and subjective sob. Suspecting chronic anemia in the setting of heavy periods 5. Most recent iron study from 2016 reviewed, noted to be low at 16. Patient reports not taking iron supplementation currently 6. Have given one dose of IV iron and continue on scheduled PO iron supplementation 7. 2 units PRBC's given, hgb corrected 2. Hx heavy menstrual periods 1. No further bleeding per patient 2. Recommend close follow up with [facility] 3. Constipation 1. Likely contributing to generalized abd pain 2. Have ordered abd xray and have reviewed. 3. Findings of stool most notably in the descending colon 4. No result with PO lactulose 5. Moderate results with soap suds enema 6. Will prescribe cathartics on discharge 4. Hx ulcers 1. No dark stools, melena, or obvious blood per rectum  Discharge Diagnoses:  Principal Problem:   Anemia    Discharge Instructions   Allergies as of 09/30/2017   No Known Allergies     Medication List    STOP taking these medications    FERIVA 21/7 75-1 MG Tabs     TAKE these medications   alum & mag hydroxide-simeth 200-200-20 MG/5ML suspension Commonly known as:  MAALOX/MYLANTA Take 15 mLs by mouth every 6 (six) hours as needed for indigestion or heartburn.   docusate sodium 100 MG capsule Commonly known as:  COLACE Take 1 capsule (100 mg total) by mouth 2 (two) times daily.   ferrous sulfate 325 (65 FE) MG tablet Take 1 tablet (325 mg total) by mouth daily with breakfast. Start taking on:  10/01/2017   ibuprofen 200 MG tablet Commonly known as:  ADVIL,MOTRIN Take 400 mg by mouth every 6 (six) hours as needed (pain).   polyethylene glycol packet Commonly known as:  MIRALAX / GLYCOLAX Take 17 g by mouth daily.   senna 8.6 MG Tabs tablet Commonly known as:  SENOKOT Take 1 tablet (8.6 mg total) by mouth daily as needed for mild constipation or moderate constipation.      Follow-up Information    Oktober, Glazer, PA-C. Schedule an appointment as soon as possible for a visit in 1 week(s).   Specialty:  Physician Assistant Contact information: [place] [id number] [phone]        [place]. Schedule an appointment as soon as possible for a visit.   Contact information: [place] [phone]         No Known Allergies   Procedures/Studies: Dg Abd Portable 1v  Result Date: 09/29/2017 CLINICAL DATA:  Watery stools.  Recent constipation EXAM: PORTABLE ABDOMEN - 1 VIEW COMPARISON:  None. FINDINGS: There  is moderate stool throughout the colon. There is no bowel dilatation or air-fluid level to suggest bowel obstruction. No free air. There are surgical clips in the gallbladder fossa region. There are phleboliths in the pelvis. IMPRESSION: Moderate stool in colon.  No bowel obstruction or free air evident. Electronically Signed   By: Lowella Grip III M.D.   On: 09/29/2017 11:37    Subjective: Eager to go home today.  Reports feeling well  Discharge Exam: Vitals:   09/30/17 1300 09/30/17 1307  BP:  106/66  Pulse:  69  Resp:  16  Temp: 98.5 F (36.9 C)   SpO2:  100%   Vitals:   09/29/17 2228 09/30/17 0444 09/30/17 1300 09/30/17 1307  BP: 103/63 (!) 100/55  106/66  Pulse: 80 77  69  Resp: 16 16  16   Temp: 98.3 F (36.8 C) 98.6 F (37 C) 98.5 F (36.9 C)   TempSrc: Oral Oral Oral   SpO2: 96% 98%  100%  Weight:      Height:        General: Pt is alert, awake, not in acute distress Cardiovascular: RRR, S1/S2 +, no rubs, no gallops Respiratory: CTA bilaterally, no wheezing, no rhonchi Abdominal: Soft, NT, ND, bowel sounds + Extremities: no edema, no cyanosis   The results of significant diagnostics from this hospitalization (including imaging, microbiology, ancillary and laboratory) are listed below for reference.     Microbiology: No results found for this or any previous visit (from the past 240 hour(s)).   Labs: BNP (last 3 results) No results for input(s): BNP in the last 8760 hours. Basic Metabolic Panel: Recent Labs  Lab 09/29/17 0212 09/30/17 0444  NA 140 140  K 3.6 4.0  CL 108 110  CO2 26 24  GLUCOSE 115* 91  BUN 7 7  CREATININE 0.71 0.83  CALCIUM 8.9 8.4*   Liver Function Tests: Recent Labs  Lab 09/29/17 0212 09/30/17 0444  AST 14* 13*  ALT 10 10  ALKPHOS 48 43  BILITOT 1.9* 2.1*  PROT 7.9 6.4*  ALBUMIN 3.9 3.4*   Recent Labs  Lab 09/29/17 0212  LIPASE 34   No results for input(s): AMMONIA in the last 168 hours. CBC: Recent Labs  Lab 09/29/17 0212 09/29/17 1702 09/30/17 0444  WBC 7.9 11.0* 7.6  HGB 5.5* 7.8* 7.9*  HCT 22.5* 28.0* 28.9*  MCV 61.5* 66.8* 68.0*  PLT 402* 369 325   Cardiac Enzymes: No results for input(s): CKTOTAL, CKMB, CKMBINDEX, TROPONINI in the last 168 hours. BNP: Invalid input(s): POCBNP CBG: No results for input(s): GLUCAP in the last 168 hours. D-Dimer No results for input(s): DDIMER in the last 72 hours. Hgb  A1c No results for input(s): HGBA1C in the last 72 hours. Lipid Profile No results for input(s): CHOL, HDL, LDLCALC, TRIG, CHOLHDL, LDLDIRECT in the last 72 hours. Thyroid function studies No results for input(s): TSH, T4TOTAL, T3FREE, THYROIDAB in the last 72 hours.  Invalid input(s): FREET3 Anemia work up No results for input(s): VITAMINB12, FOLATE, FERRITIN, TIBC, IRON, RETICCTPCT in the last 72 hours. Urinalysis    Component Value Date/Time   COLORURINE STRAW (A) 09/29/2017 0202   APPEARANCEUR CLEAR 09/29/2017 0202   LABSPEC 1.003 (L) 09/29/2017 0202   PHURINE 8.0 09/29/2017 0202   GLUCOSEU NEGATIVE 09/29/2017 0202   HGBUR NEGATIVE 09/29/2017 0202   BILIRUBINUR NEGATIVE 09/29/2017 0202   KETONESUR NEGATIVE 09/29/2017 0202   PROTEINUR NEGATIVE 09/29/2017 0202   UROBILINOGEN 0.2 04/13/2013 2355   NITRITE NEGATIVE  09/29/2017 0202   LEUKOCYTESUR NEGATIVE 09/29/2017 0202   Sepsis Labs Invalid input(s): PROCALCITONIN,  WBC,  LACTICIDVEN Microbiology No results found for this or any previous visit (from the past 240 hour(s)).  Time spent: 65min  SIGNED:   Marylu Lund, MD  [facility] 09/30/2017, 3:38 PM  If 7PM-7AM, please contact night-coverage [email or web address] Password [email or web address]

## 2017-09-30 NOTE — Care Management Note (Signed)
Case Management Note  Patient Details  Name: Gabrielle Hines MRN: [id number] Date of Birth: 12-Mar-1966  Subjective/Objective: Provided w/PCP listing-encouraged [facility];also provided w[facility] med list.No further Cm needs.                  Action/Plan:d/c home.   Expected Discharge Date:  09/30/17               Expected Discharge Plan:  Home/Self Care  In-House Referral:     Discharge planning Services  CM Consult, [facility]  Post Acute Care Choice:    Choice offered to:     DME Arranged:    DME Agency:     HH Arranged:    HH Agency:     Status of Service:  Completed, signed off  If discussed at [employer], dates discussed:    Additional Comments:  Dessa Phi, RN 09/30/2017, 3:49 PM

## 2017-09-30 NOTE — Progress Notes (Addendum)
Soap suds enema given to pt and she had moderate results. States she feels like she is going to go again. Discharge instructions explained to patient. Information given to patient for constipation and anemia. Prescriptions given to patient. Husband in to take patient home.

## 2018-07-15 ENCOUNTER — Encounter (HOSPITAL_COMMUNITY): Payer: Self-pay

## 2018-07-15 ENCOUNTER — Emergency Department (HOSPITAL_COMMUNITY): Payer: Self-pay

## 2018-07-15 ENCOUNTER — Emergency Department (HOSPITAL_COMMUNITY)
Admission: EM | Admit: 2018-07-15 | Discharge: 2018-07-16 | Disposition: A | Discharge status: Home / Self Care | Payer: Self-pay | Attending: Emergency Medicine | Admitting: Emergency Medicine

## 2018-07-15 ENCOUNTER — Other Ambulatory Visit: Payer: Self-pay

## 2018-07-15 DIAGNOSIS — D509 Iron deficiency anemia, unspecified: Secondary | ICD-10-CM | POA: Insufficient documentation

## 2018-07-15 DIAGNOSIS — D473 Essential (hemorrhagic) thrombocythemia: Secondary | ICD-10-CM | POA: Insufficient documentation

## 2018-07-15 DIAGNOSIS — D75839 Thrombocytosis, unspecified: Secondary | ICD-10-CM

## 2018-07-15 DIAGNOSIS — R197 Diarrhea, unspecified: Secondary | ICD-10-CM | POA: Insufficient documentation

## 2018-07-15 DIAGNOSIS — R1013 Epigastric pain: Secondary | ICD-10-CM | POA: Insufficient documentation

## 2018-07-15 DIAGNOSIS — R0602 Shortness of breath: Secondary | ICD-10-CM | POA: Insufficient documentation

## 2018-07-15 DIAGNOSIS — Z79899 Other long term (current) drug therapy: Secondary | ICD-10-CM | POA: Insufficient documentation

## 2018-07-15 LAB — COMPREHENSIVE METABOLIC PANEL
ALT: 14 U/L (ref 0–44)
AST: 17 U/L (ref 15–41)
Albumin: 3.8 g/dL (ref 3.5–5.0)
Alkaline Phosphatase: 54 U/L (ref 38–126)
Anion gap: 8 (ref 5–15)
BUN: 5 mg/dL — ABNORMAL LOW (ref 6–20)
CO2: 25 mmol/L (ref 22–32)
Calcium: 9.2 mg/dL (ref 8.9–10.3)
Chloride: 105 mmol/L (ref 98–111)
Creatinine, Ser: 0.83 mg/dL (ref 0.44–1.00)
GFR calc Af Amer: >60 mL/min (ref >60)
GFR calc non Af Amer: >60 mL/min (ref >60)
Glucose, Bld: 120 mg/dL — ABNORMAL HIGH (ref 70–99)
Potassium: 3.7 mmol/L (ref 3.5–5.1)
Sodium: 138 mmol/L (ref 135–145)
Total Bilirubin: 1.4 mg/dL — ABNORMAL HIGH (ref 0.3–1.2)
Total Protein: 7.3 g/dL (ref 6.5–8.1)

## 2018-07-15 LAB — URINALYSIS, ROUTINE W REFLEX MICROSCOPIC
Bilirubin Urine: NEGATIVE
Glucose, UA: NEGATIVE mg/dL
Hgb urine dipstick: NEGATIVE
Ketones, ur: NEGATIVE mg/dL
Leukocytes,Ua: NEGATIVE
Nitrite: NEGATIVE
Protein, ur: NEGATIVE mg/dL
Specific Gravity, Urine: 1.002 — ABNORMAL LOW (ref 1.005–1.030)
pH: 6 (ref 5.0–8.0)

## 2018-07-15 LAB — CBC
HCT: 32.7 % — ABNORMAL LOW (ref 36.0–46.0)
Hemoglobin: 8.7 g/dL — ABNORMAL LOW (ref 12.0–15.0)
MCH: 18.1 pg — ABNORMAL LOW (ref 26.0–34.0)
MCHC: 26.6 g/dL — ABNORMAL LOW (ref 30.0–36.0)
MCV: 68.1 fL — ABNORMAL LOW (ref 80.0–100.0)
Platelets: 444 10*3/uL — ABNORMAL HIGH (ref 150–400)
RBC: 4.8 MIL/uL (ref 3.87–5.11)
RDW: 24.9 % — ABNORMAL HIGH (ref 11.5–15.5)
WBC: 10.8 10*3/uL — ABNORMAL HIGH (ref 4.0–10.5)
nRBC: 0 % (ref 0.0–0.2)

## 2018-07-15 LAB — D-DIMER, QUANTITATIVE (NOT AT ARMC): D-Dimer, Quant: <0.27 ug/mL-FEU (ref 0.00–0.50)

## 2018-07-15 LAB — TROPONIN I: Troponin I: <0.03 ng/mL (ref <0.03)

## 2018-07-15 LAB — I-STAT BETA HCG BLOOD, ED (MC, WL, AP ONLY): I-stat hCG, quantitative: <5 m[IU]/mL (ref <5)

## 2018-07-15 LAB — LIPASE, BLOOD: Lipase: 42 U/L (ref 11–51)

## 2018-07-15 MED ORDER — LIDOCAINE VISCOUS HCL 2 % MT SOLN
15.0000 mL | Freq: Once | OROMUCOSAL | Status: AC
  Administered 2018-07-15: 23:00:00 15 mL via ORAL
  Filled 2018-07-15: qty 15

## 2018-07-15 MED ORDER — ALUM & MAG HYDROXIDE-SIMETH 200-200-20 MG/5ML PO SUSP
30.0000 mL | Freq: Once | ORAL | Status: AC
Start: 2018-07-15 — End: 2018-07-15
  Administered 2018-07-15: 30 mL via ORAL
  Filled 2018-07-15: qty 30

## 2018-07-15 MED ORDER — IOHEXOL 300 MG/ML  SOLN
100.0000 mL | Freq: Once | INTRAMUSCULAR | Status: AC | PRN
  Administered 2018-07-15: 100 mL via INTRAVENOUS

## 2018-07-15 MED ORDER — SODIUM CHLORIDE 0.9% FLUSH: 3.0000 mL | Freq: Once | INTRAVENOUS | Status: DC

## 2018-07-15 MED ORDER — FAMOTIDINE 20 MG PO TABS
40.0000 mg | ORAL_TABLET | Freq: Once | ORAL | Status: AC
  Administered 2018-07-15: 23:00:00 40 mg via ORAL
  Filled 2018-07-15: qty 2

## 2018-07-15 NOTE — ED Provider Notes (Signed)
[place] EMERGENCY DEPARTMENT Provider Note   CSN: [id number] Arrival date & time: 07/15/18  2121    History   Chief Complaint Chief Complaint  Patient presents with  . Abdominal Pain    HPI Gabrielle HENANDEZ is a 53 y.o. female.     53 year old female with past medical history including abnormal uterine bleeding, anemia, cholecystectomy, distant history of gastric ulcer who presents with multiple complaints including abdominal pain.  Patient presented to an outside hospital ER on 4/6 with complaints of epigastric abdominal pain, nonbloody diarrhea, and shortness of breath.  She has continued to have these symptoms since that time.  She tested negative for COVID-19.  He has been evaluated 2 other times in the ED for similar symptoms.  She states that she has had a nonproductive cough for the past 2 weeks.  She reports subjective fevers but has not measured her temperature.  Her abdominal pain is intermittent, nonradiating, and severe at times.  Nothing makes it better or worse.  She has 2 episodes of non-bloody diarrhea per day. No recent antibiotics. She reports that her shortness of breath has been going on for several weeks.  She has occasional chest tightness with it.  No sick contacts or recent travel.  She was given a prescription medication at her last ED visit that she tried today with no relief.  She denies any NSAID use or heavy alcohol use.  The history is provided by the patient.  Abdominal Pain    Past Medical History:  Diagnosis Date  . Abnormal uterine bleeding (AUB)   . Gastric ulcer   . History of anemia 1985  . History of blood transfusion 1983    Patient Active Problem List   Diagnosis Date Noted  . Vasovagal near-syncope 04/14/2013  . Anemia 02/13/2013    Past Surgical History:  Procedure Laterality Date  . BREAST ENHANCEMENT SURGERY  2002  . CHOLECYSTECTOMY     Age 68     OB History    Gravida  5   Para  4   Term  4   Preterm  0   AB  1   Living  5     SAB  1   TAB  0   Ectopic  0   Multiple  1   Live Births  5            Home Medications    Prior to Admission medications   Medication Sig Start Date End Date Taking? Authorizing Provider  alum & mag hydroxide-simeth (MAALOX/MYLANTA) 200-200-20 MG/5ML suspension Take 15 mLs by mouth every 6 (six) hours as needed for indigestion or heartburn.    [provider]  ferrous sulfate 325 (65 FE) MG tablet Take 1 tablet (325 mg total) by mouth daily with breakfast. 10/01/17 10/31/17  Donne Hazel, MD  ibuprofen (ADVIL,MOTRIN) 200 MG tablet Take 400 mg by mouth every 6 (six) hours as needed (pain).    [provider]  polyethylene glycol (MIRALAX / GLYCOLAX) packet Take 17 g by mouth daily. 09/30/17   Donne Hazel, MD    Family History Family History  Problem Relation Age of Onset  . Hypertension Mother   . Heart disease Mother   . Hyperlipidemia Mother   . Hearing loss Father   . Hyperlipidemia Father   . Cancer Maternal Grandfather   . Hypertension Other   . Cancer Other   . Hyperlipidemia Other     Social History  Social History   Tobacco Use  . Smoking status: Never Smoker  . Smokeless tobacco: Never Used  Substance Use Topics  . Alcohol use: No    Alcohol/week: 0.0 standard drinks  . Drug use: No     Allergies   Patient has no known allergies.   Review of Systems Review of Systems  Gastrointestinal: Positive for abdominal pain.   All other systems reviewed and are negative except that which was mentioned in HPI   Physical Exam Updated Vital Signs BP 118/73 (BP Location: Right Arm)   Pulse 71   Temp 98.4 F (36.9 C) (Oral)   Resp 16   SpO2 100%   Physical Exam Vitals signs and nursing note reviewed.  Constitutional:      General: She is not in acute distress.    Appearance: She is well-developed.  HENT:     Head: Normocephalic and atraumatic.  Eyes:     Conjunctiva/sclera:  Conjunctivae normal.  Neck:     Musculoskeletal: Neck supple.  Cardiovascular:     Rate and Rhythm: Normal rate and regular rhythm.     Heart sounds: Normal heart sounds. No murmur.  Pulmonary:     Effort: Pulmonary effort is normal.     Breath sounds: Normal breath sounds.  Abdominal:     General: Bowel sounds are normal. There is no distension.     Palpations: Abdomen is soft.     Tenderness: There is abdominal tenderness in the epigastric area. There is no guarding or rebound.  Skin:    General: Skin is warm and dry.  Neurological:     Mental Status: She is alert and oriented to person, place, and time.     Comments: Fluent speech  Psychiatric:        Judgment: Judgment normal.      ED Treatments / Results  Labs (all labs ordered are listed, but only abnormal results are displayed) Labs Reviewed  COMPREHENSIVE METABOLIC PANEL - Abnormal; Notable for the following components:      Result Value   Glucose, Bld 120 (*)    BUN 5 (*)    Total Bilirubin 1.4 (*)    All other components within normal limits  CBC - Abnormal; Notable for the following components:   WBC 10.8 (*)    Hemoglobin 8.7 (*)    HCT 32.7 (*)    MCV 68.1 (*)    MCH 18.1 (*)    MCHC 26.6 (*)    RDW 24.9 (*)    Platelets 444 (*)    All other components within normal limits  URINALYSIS, ROUTINE W REFLEX MICROSCOPIC - Abnormal; Notable for the following components:   Color, Urine COLORLESS (*)    Specific Gravity, Urine 1.002 (*)    All other components within normal limits  LIPASE, BLOOD  TROPONIN I  D-DIMER, QUANTITATIVE (NOT AT [facility])  I-STAT BETA HCG BLOOD, ED ([facility], [facility], [facility] ONLY)    EKG EKG Interpretation  Date/Time:  Tuesday July 15 2018 21:32:31 EDT Ventricular Rate:  72 PR Interval:    QRS Duration: 85 QT Interval:  394 QTC Calculation: 432 R Axis:   65 Text Interpretation:  Sinus rhythm Consider left ventricular hypertrophy Artifact in lead(s) I III aVR aVL aVF No significant change  since last tracing Confirmed by Theotis Burrow [phone]) on 07/15/2018 9:41:35 PM   Radiology No results found.  Procedures Procedures (including critical care time)  Medications Ordered in ED Medications  sodium chloride flush (NS) 0.9 %  injection 3 mL (3 mLs Intravenous Not Given 07/15/18 2244)  alum & mag hydroxide-simeth (MAALOX/MYLANTA) 200-200-20 MG/5ML suspension 30 mL (30 mLs Oral Given 07/15/18 2242)    And  lidocaine (XYLOCAINE) 2 % viscous mouth solution 15 mL (15 mLs Oral Given 07/15/18 2242)  famotidine (PEPCID) tablet 40 mg (40 mg Oral Given 07/15/18 2241)  iohexol (OMNIPAQUE) 300 MG/ML solution 100 mL (100 mLs Intravenous Contrast Given 07/15/18 2334)     Initial Impression / Assessment and Plan / ED Course  I have reviewed the triage vital signs and the nursing notes.  Pertinent labs & imaging results that were available during my care of the patient were reviewed by me and considered in my medical decision making (see chart for details).      She was well-appearing on exam, afebrile, normal vital signs.  Epigastric tenderness noted.  I reviewed her 3 previous ED visits including lab work and x-rays. Given chronicity, highly doubt acute appendicitis, bowel obstruction, perforation. DDx includes pancreatitis, PUD, IBD, diverticulitis.   Labs show Hgb 8.7 stable from previous values, WBC 10.8, normal trop, negative d-dimer making PE very unlikely. EKG without ischemic changes.  Given associated gastrointestinal symptoms and the chronicity of her complaints, I feel that ACS is very unlikely.   I am signing out to the oncoming provider.  Patient has CT abdomen/pelvis pending and I have discussed with her the need for gastroenterology clinic evaluation if imaging is negative.  I have also discussed empiric PUD treatment with PPI.  Patient signed out to oncoming provider pending imaging results.  Final Clinical Impressions(s) / ED Diagnoses   Final diagnoses:  None    ED  Discharge Orders    None       Warrick Llera, Wenda Overland, MD 07/16/18 0008

## 2018-07-15 NOTE — ED Triage Notes (Signed)
Pt states that she has been having upper abd pain for the past weeks,with diarrhea at least twice a day, nausea. Reports fevers at home, afebrile at this time and a nonproductive cough for a week. Went to a hospital in [place] on Friday for the same.

## 2018-07-16 MED ORDER — OMEPRAZOLE 20 MG PO CPDR: 20.0000 mg | DELAYED_RELEASE_CAPSULE | Freq: Every day | ORAL | 0 refills | Status: DC

## 2018-07-16 MED ORDER — SUCRALFATE 1 G PO TABS: 1.0000 g | ORAL_TABLET | Freq: Three times a day (TID) | ORAL | 0 refills | Status: DC

## 2018-07-16 NOTE — Discharge Instructions (Signed)
You may take antacids as needed  You may take acetaminophen as needed.  Do not take ibuprofen or naproxen or aspirin.

## 2018-07-16 NOTE — ED Provider Notes (Signed)
Care assumed from Dr. Rex Kras.  Patient with abdominal pain and has several recent ED visits for same, pending CT of abdomen and pelvis.  CT scan shows no acute process.  Fibroid uterus is noted.  Patient was advised of these findings.  She is discharged with prescriptions for omeprazole and sucralfate, referred to gastroenterology for follow-up.  Ct Abdomen Pelvis W Contrast  Result Date: 07/16/2018 CLINICAL DATA:  Abdominal pain EXAM: CT ABDOMEN AND PELVIS WITH CONTRAST TECHNIQUE: Multidetector CT imaging of the abdomen and pelvis was performed using the standard protocol following bolus administration of intravenous contrast. CONTRAST:  122mL OMNIPAQUE IOHEXOL 300 MG/ML  SOLN COMPARISON:  10/15/2007 FINDINGS: Lower chest: Left lower lobe nodule measures 3 mm on image 8. Lung bases otherwise clear. No effusions. Hepatobiliary: No focal liver abnormality is seen. Status post cholecystectomy. No biliary dilatation. Pancreas: No focal abnormality or ductal dilatation. Spleen: No focal abnormality.  Normal size. Adrenals/Urinary Tract: No adrenal abnormality. No focal renal abnormality. No stones or hydronephrosis. Urinary bladder is unremarkable. Stomach/Bowel: Normal appendix. Stomach, large and small bowel grossly unremarkable. Vascular/Lymphatic: No evidence of aneurysm or adenopathy. Reproductive: Enlarged fibroid uterus. Largest fibroid is in the right side of the uterus measuring up to 7.3 cm. No adnexal masses. Other: Trace free fluid in the pelvis.  No free air. Musculoskeletal: No acute bony abnormality. IMPRESSION: Enlarged fibroid uterus. Normal appendix. No acute findings in the abdomen or pelvis. Electronically Signed   By: Rolm Baptise M.D.   On: 07/16/2018 01:28    Images viewed by me.    Delora Fuel, MD 83/41/96 [phone]

## 2019-05-08 ENCOUNTER — Ambulatory Visit: Payer: BLUE CROSS/BLUE SHIELD | Attending: Family

## 2019-05-08 DIAGNOSIS — U071 COVID-19: Secondary | ICD-10-CM | POA: Insufficient documentation

## 2019-05-08 DIAGNOSIS — Z20822 Contact with and (suspected) exposure to covid-19: Secondary | ICD-10-CM

## 2019-05-09 LAB — NOVEL CORONAVIRUS, NAA: SARS-CoV-2, NAA: DETECTED — AB

## 2019-05-11 ENCOUNTER — Other Ambulatory Visit: Payer: Self-pay

## 2019-05-12 ENCOUNTER — Ambulatory Visit: Payer: BLUE CROSS/BLUE SHIELD | Attending: Internal Medicine

## 2019-05-12 DIAGNOSIS — Z20822 Contact with and (suspected) exposure to covid-19: Secondary | ICD-10-CM

## 2019-05-13 LAB — NOVEL CORONAVIRUS, NAA: SARS-CoV-2, NAA: DETECTED — AB

## 2019-05-18 ENCOUNTER — Ambulatory Visit: Payer: BLUE CROSS/BLUE SHIELD | Attending: Internal Medicine

## 2019-05-18 DIAGNOSIS — Z20822 Contact with and (suspected) exposure to covid-19: Secondary | ICD-10-CM

## 2019-05-19 LAB — NOVEL CORONAVIRUS, NAA: SARS-CoV-2, NAA: NOT DETECTED

## 2020-05-09 ENCOUNTER — Encounter: Payer: Self-pay | Admitting: Advanced Practice Midwife

## 2020-05-09 ENCOUNTER — Ambulatory Visit (INDEPENDENT_AMBULATORY_CARE_PROVIDER_SITE_OTHER): Payer: Self-pay | Admitting: Advanced Practice Midwife

## 2020-05-09 ENCOUNTER — Encounter: Payer: Self-pay | Admitting: *Deleted

## 2020-05-09 ENCOUNTER — Other Ambulatory Visit: Payer: Self-pay

## 2020-05-09 ENCOUNTER — Other Ambulatory Visit (HOSPITAL_COMMUNITY)
Admission: RE | Admit: 2020-05-09 | Discharge: 2020-05-09 | Disposition: A | Discharge status: Home / Self Care | Payer: BLUE CROSS/BLUE SHIELD | Source: Ambulatory Visit | Attending: Advanced Practice Midwife | Admitting: Advanced Practice Midwife

## 2020-05-09 VITALS — BP 124/75 | HR 84 | Ht 64.0 in | Wt 184.0 lb

## 2020-05-09 DIAGNOSIS — Z30430 Encounter for insertion of intrauterine contraceptive device: Secondary | ICD-10-CM

## 2020-05-09 DIAGNOSIS — N924 Excessive bleeding in the premenopausal period: Secondary | ICD-10-CM | POA: Insufficient documentation

## 2020-05-09 DIAGNOSIS — Z124 Encounter for screening for malignant neoplasm of cervix: Secondary | ICD-10-CM | POA: Insufficient documentation

## 2020-05-09 DIAGNOSIS — Z1231 Encounter for screening mammogram for malignant neoplasm of breast: Secondary | ICD-10-CM

## 2020-05-09 DIAGNOSIS — Z113 Encounter for screening for infections with a predominantly sexual mode of transmission: Secondary | ICD-10-CM

## 2020-05-09 DIAGNOSIS — N939 Abnormal uterine and vaginal bleeding, unspecified: Secondary | ICD-10-CM | POA: Insufficient documentation

## 2020-05-09 MED ORDER — MEGESTROL ACETATE 40 MG PO TABS: 40.0000 mg | ORAL_TABLET | Freq: Two times a day (BID) | ORAL | 3 refills | Status: DC

## 2020-05-09 NOTE — Progress Notes (Addendum)
Self Pay NEW GYN/AEX/STD Screening.  C/o night sweats, insomnia, heavy periods, she wears 3 overnight pads at a time and changes them 1.5 hours.  Patient verbalized understanding that she will receive a bill for the STD screening.  PHQ-9=5

## 2020-05-09 NOTE — Progress Notes (Signed)
Subjective:     Gabrielle Hines is a 55 y.o. female here at [facility] for a routine exam.  Current complaints: heavy regular periods, becoming heavier in recent years.  Personal health questionnaire reviewed: yes.  Do you have a primary care provider? yes Do you feel safe at home? yes  [place] Office Visit from 05/09/2020 in [place]  PHQ-2 Total Score 1      Risk factors for chronic health problems: Smoking: Never Alchohol/how much: 1 glass of wine weekly Pt BMI: Body mass index is 31.58 kg/m.   Gynecologic History Patient's last menstrual period was 04/18/2020 (exact date). Contraception: tubal ligation Last Pap: unknown. Results were: normal per pt Last mammogram: unknown. Results were: normal per pt  Obstetric History OB History  Gravida Para Term Preterm AB Living  5 4 4  0 1 5  SAB IAB Ectopic Multiple Live Births  1 0 0 1 5    # Outcome Date GA Lbr Len/2nd Weight Sex Delivery Anes PTL Lv  5A Term 11/14/90 [redacted]w[redacted]d  4 lb 9 oz (2.07 kg) M Vag-Spont EPI  LIV  5B Term 11/14/90 [redacted]w[redacted]d  5 lb 6 oz (2.438 kg) F Vag-Spont EPI  LIV  4 Term 12/04/89 [redacted]w[redacted]d  6 lb 11 oz (3.033 kg) F Vag-Spont EPI  LIV  3 Term 01/11/89 [redacted]w[redacted]d  7 lb 8 oz (3.402 kg) F Vag-Spont EPI  LIV  2 Term 09/09/86 [redacted]w[redacted]d  8 lb 14 oz (4.026 kg) F Vag-Spont EPI  LIV  1 SAB 1986 [redacted]w[redacted]d            The following portions of the patient's history were reviewed and updated as appropriate: allergies, current medications, past family history, past medical history, past social history, past surgical history and problem list.  Review of Systems Pertinent items noted in HPI and remainder of comprehensive ROS otherwise negative.    Objective:   BP 124/75   Pulse 84   Ht 5\' 4"  (1.626 m)   Wt 184 lb (83.5 kg)   LMP 04/18/2020 (Exact Date)   BMI 31.58 kg/m  VS reviewed, nursing note reviewed,  Constitutional: well developed, well nourished, no distress HEENT: normocephalic CV: normal  rate Pulm/chest wall: normal effort Breast Exam:  performed: right breast normal without mass, skin or nipple changes or axillary nodes, left breast normal without mass, skin or nipple changes or axillary nodes Abdomen: soft Neuro: alert and oriented x 3 Skin: warm, dry Psych: affect normal Pelvic exam:Pe rformed: Cervix pink, visually closed, without lesion, scant white creamy discharge, vaginal walls and external genitalia normal Bimanual exam: Cervix 0/long/high, firm, anterior, neg CMT, uterus nontender, enlarged and irregular in shape, adnexa without tenderness, enlargement, or mass       Assessment/Plan:   1. Abnormal uterine bleeding (AUB) --Heavy regular menses, increasing in amount in last 1-2 years --Possible uterine fibroids with enlarged uterus on today's exam --Discussed options for AUB including medications like Megace, IUD insertion, ablation, and hysterectomy.  Pt does not want IUD and would prefer to start pills now and schedule ablation procedure.  --Pt applying for [facility] discount as she is self pay --F/U with MD for preop counseling for ablation, consider endometrial biopsy as needed  - US PELVIC COMPLETE WITH TRANSVAGINAL; Future - megestrol (MEGACE) 40 MG tablet; Take 1 tablet (40 mg total) by mouth 2 (two) times daily.  Dispense: 60 tablet; Refill: 3  2. Abnormal perimenopausal bleeding   3. Screening for cervical  cancer  - Cytology - PAP( [place])  4. Encounter for screening mammogram for malignant neoplasm of breast  - MM Digital Screening; Future  5. Screen for STD (sexually transmitted disease)  - Cervicovaginal ancillary only( [place]) - RPR+HBsAg+HCVAb+...    Follow up in: 2 months or as needed.   Fatima Blank, CNM 5:37 PM

## 2020-05-10 LAB — RPR+HBSAG+HCVAB+...
HIV Screen 4th Generation wRfx: NONREACTIVE
Hep C Virus Ab: <0.1 s/co ratio (ref 0.0–0.9)
Hepatitis B Surface Ag: NEGATIVE
RPR Ser Ql: NONREACTIVE

## 2020-05-11 LAB — CERVICOVAGINAL ANCILLARY ONLY
Chlamydia: NEGATIVE
Comment: NEGATIVE
Comment: NEGATIVE
Comment: NORMAL
Neisseria Gonorrhea: NEGATIVE
Trichomonas: NEGATIVE

## 2020-05-12 LAB — CYTOLOGY - PAP
Adequacy: ABSENT
Comment: NEGATIVE
Diagnosis: NEGATIVE
High risk HPV: NEGATIVE

## 2020-06-15 ENCOUNTER — Telehealth: Payer: Self-pay

## 2020-06-15 NOTE — Telephone Encounter (Signed)
After consulting with the provider, Megace is the best medication for her at this time and he recommends patient to continue it. During call, patient said she thinks Megace is making the bleeding worst. Pt reports not taking Megace today and her bleeding changed from flowing like a period to spotting. Pt denied heavy bleeding so I informed her Megace is working but she was hoping the medication would stop the bleeding completely. I informed pt I understand her frustration, the fibroids are large so there is no quick fix for bleeding. Advised pt to continue taking the Megace as directed and keep her upcoming appt. If bleeding gets heavier and she begins to change pads/tampons every hour or less, please contact the office. Pt agrees and has no further questions.

## 2020-06-15 NOTE — Telephone Encounter (Signed)
Pt voiced frustration about vaginal bleeding.  Pt reports bleeding unchanged since starting Megace last month  Bleeding ranges from spotting to heavy flow some days She denies changing pads every hour or less  She was seen at [facility] ER on 06/12/20 and was told to follow up with OBGYN. Pt aware I will consult with a provider and call her back.

## 2020-06-16 ENCOUNTER — Telehealth: Payer: Self-pay

## 2020-06-16 NOTE — Telephone Encounter (Signed)
I called pt to offer an earlier appt for vaginal bleeding. Pt declined. She is about to go on vacation and requests to keep the appts as scheduled. Pt can discontinue Megace but restart it once heavy bleeding returns per MD recommendation. Pt agrees and has no further questions.

## 2020-06-27 ENCOUNTER — Ambulatory Visit
Admission: RE | Admit: 2020-06-27 | Discharge: 2020-06-27 | Disposition: A | Discharge status: Home / Self Care | Payer: Self-pay | Source: Ambulatory Visit | Attending: Advanced Practice Midwife | Admitting: Advanced Practice Midwife

## 2020-06-27 ENCOUNTER — Other Ambulatory Visit: Payer: Self-pay

## 2020-06-27 DIAGNOSIS — N939 Abnormal uterine and vaginal bleeding, unspecified: Secondary | ICD-10-CM | POA: Insufficient documentation

## 2020-06-30 ENCOUNTER — Ambulatory Visit (INDEPENDENT_AMBULATORY_CARE_PROVIDER_SITE_OTHER): Payer: Self-pay | Admitting: Obstetrics and Gynecology

## 2020-06-30 ENCOUNTER — Encounter: Payer: Self-pay | Admitting: Obstetrics and Gynecology

## 2020-06-30 ENCOUNTER — Other Ambulatory Visit: Payer: Self-pay

## 2020-06-30 ENCOUNTER — Ambulatory Visit: Payer: Self-pay | Admitting: Obstetrics and Gynecology

## 2020-06-30 ENCOUNTER — Other Ambulatory Visit: Payer: Self-pay | Admitting: Obstetrics and Gynecology

## 2020-06-30 VITALS — BP 142/80 | HR 80 | Wt 187.0 lb

## 2020-06-30 DIAGNOSIS — N939 Abnormal uterine and vaginal bleeding, unspecified: Secondary | ICD-10-CM

## 2020-06-30 DIAGNOSIS — Z712 Person consulting for explanation of examination or test findings: Secondary | ICD-10-CM

## 2020-06-30 DIAGNOSIS — D259 Leiomyoma of uterus, unspecified: Secondary | ICD-10-CM

## 2020-06-30 DIAGNOSIS — D250 Submucous leiomyoma of uterus: Secondary | ICD-10-CM

## 2020-06-30 NOTE — Patient Instructions (Signed)
Uterine Artery Embolization for Fibroids  Uterine artery embolization is a procedure to shrink uterine fibroids. Uterine fibroids are masses of tissue (tumors) that can develop in the womb (uterus). They are also called leiomyomas. This type of tumor is not cancerous (benign) and does not spread to other parts of the body outside of the pelvic area. The pelvic area is the part of the body between the hip bones. You can have one or many fibroids. Fibroids can vary in size, shape, weight, and where they grow in the uterus. Some can become quite large. In this procedure, a thin plastic tube (catheter) is used to inject a chemical that blocks off the blood supply to the fibroid, which causes the fibroid to shrink. Tell a health care provider about:  Any allergies you have.  All medicines you are taking, including vitamins, herbs, eye drops, creams, and over-the-counter medicines.  Any problems you or family members have had with anesthetic medicines.  Any blood disorders you have.  Any surgeries you have had.  Any medical conditions you have.  Whether you are pregnant or may be pregnant. What are the risks? Generally, this is a safe procedure. However, problems may occur, including:  Bleeding.  Allergic reactions to medicines or dyes.  Damage to other structures or organs.  Infection, including blood infection (septicemia).  Injury to the uterus from decreased blood supply.  Lack of menstrual periods (amenorrhea).  Death of tissue cells (necrosis) around your bladder or vulva.  Development of a hole between organs or from an organ to the surface of your skin (fistula).  Blood clot in the legs (deep vein thrombosis) or lung (pulmonary embolus).  Nausea and vomiting. What happens before the procedure? Staying hydrated Follow instructions from your health care provider about hydration, which may include:  Up to 2 hours before the procedure - you may continue to drink clear  liquids, such as water, clear fruit juice, black coffee, and plain tea. Eating and drinking restrictions Follow instructions from your health care provider about eating and drinking, which may include:  8 hours before the procedure - stop eating heavy meals or foods such as meat, fried foods, or fatty foods.  6 hours before the procedure - stop eating light meals or foods, such as toast or cereal.  6 hours before the procedure - stop drinking milk or drinks that contain milk.  2 hours before the procedure - stop drinking clear liquids. Medicines  Ask your health care provider about: ? Changing or stopping your regular medicines. This is especially important if you are taking diabetes medicines or blood thinners. ? Taking over-the-counter medicines, vitamins, herbs, and supplements. ? Taking medicines such as aspirin and ibuprofen. These medicines can thin your blood. Do not take these medicines unless your health care provider tells you to take them.  You may be given antibiotic medicine to help prevent infection.  You may be given medicine to prevent nausea and vomiting (antiemetic). General instructions  Ask your health care provider how your surgical site will be marked or identified.  You may be asked to shower with a germ-killing soap.  Plan to have someone take you home from the hospital or clinic.  If you will be going home right after the procedure, plan to have someone with you for 24 hours.  You will be asked to empty your bladder. What happens during the procedure?  To lower your risk of infection: ? Your health care team will wash or sanitize their hands. ?   Hair may be removed from the surgical area. ? Your skin will be washed with soap.  An IV will be inserted into one of your veins.  You will be given one or more of the following: ? A medicine to help you relax (sedative). ? A medicine to numb the area (local anesthetic).  A small cut (incision) will be made  in your groin.  A catheter will be inserted into the main artery of your leg. The catheter will be guided through the artery to your uterus.  A series of images will be taken while dye is injected through the catheter in your groin. X-rays are taken at the same time. This is done to provide a road map of the blood supply to your uterus and fibroids.  Tiny plastic spheres, about the size of sand grains, will be injected through the catheter. Metal coils may be used to help block the artery. The particles will lodge in tiny branches of the uterine artery that supplies blood to the fibroids.  The procedure will be repeated on the artery that supplies the other side of the uterus.  The catheter will be removed and pressure will be applied to stop the bleeding.  A dressing will be placed over the incision. The procedure may vary among health care providers and hospitals. What happens after the procedure?  Your blood pressure, heart rate, breathing rate, and blood oxygen level will be monitored until the medicines you were given have worn off.  You will be given pain medicine as needed.  You may be given medicine for nausea and vomiting as needed.  Do not drive for 24 hours if you were given a sedative. Summary  Uterine artery embolization is a procedure to shrink uterine fibroids by blocking the blood supply to the fibroid.  You may be given a sedative and local anesthetic for the procedure.  A catheter will be inserted into the main artery of your leg. The catheter will be guided through the artery to your uterus.  After the procedure you will be given pain medicine and medicine for nausea as needed.  Do not drive for 24 hours if you were given a sedative. This information is not intended to replace advice given to you by your health care provider. Make sure you discuss any questions you have with your health care provider. Document Revised: 02/22/2017 Document Reviewed:  06/14/2016 Elsevier Patient Education  2021 [employer].

## 2020-06-30 NOTE — Progress Notes (Signed)
54 yo P5 presenting today to discuss results of pelvic ultrasound. Pelvic ultrasound ordered to the evaluation of a regular monthly period heavy in flow in recent years. Patient was offered medical management with megace. She reports no improvement with megace  Past Medical History:  Diagnosis Date  . Abnormal uterine bleeding (AUB)   . Anemia   . Fibroid   . Gastric ulcer   . History of anemia 1985  . History of blood transfusion 1983  . Vaginal Pap smear, abnormal    Past Surgical History:  Procedure Laterality Date  . BREAST ENHANCEMENT SURGERY  2002  . BREAST SURGERY    . CHOLECYSTECTOMY     Age 13  . GALLBLADDER SURGERY     Family History  Problem Relation Age of Onset  . Hypertension Mother   . Heart disease Mother   . Hyperlipidemia Mother   . Hearing loss Father   . Hyperlipidemia Father   . Cancer Maternal Grandfather   . Hypertension Other   . Cancer Other   . Hyperlipidemia Other    Social History   Tobacco Use  . Smoking status: Never Smoker  . Smokeless tobacco: Never Used  Vaping Use  . Vaping Use: Never used  Substance Use Topics  . Alcohol use: Yes    Alcohol/week: 1.0 standard drink    Types: 1 Glasses of wine per week  . Drug use: No   ROS See pertinent in HPI. All other systems reviewed and non contributory  Blood pressure (!) 142/80, pulse 80, weight 187 lb (84.8 kg).  GENERAL: Well-developed, well-nourished female in no acute distress.  ABDOMEN: Soft, nontender, nondistended. No organomegaly. Palpable fibroid uterus PELVIC: declined EXTREMITIES: No cyanosis, clubbing, or edema, 2+ distal pulses.  US PELVIC COMPLETE WITH TRANSVAGINAL  Result Date: 06/27/2020 CLINICAL DATA:  Initial evaluation for abnormal uterine bleeding. Premenopausal. EXAM: TRANSABDOMINAL AND TRANSVAGINAL ULTRASOUND OF PELVIS TECHNIQUE: Both transabdominal and transvaginal ultrasound examinations of the pelvis were performed. Transabdominal technique was performed for  global imaging of the pelvis including uterus, ovaries, adnexal regions, and pelvic cul-de-sac. It was necessary to proceed with endovaginal exam following the transabdominal exam to visualize the uterus, endometrium, and ovaries. COMPARISON:  Prior CT from 07/16/2018 FINDINGS: Uterus Measurements: 16.8 x 9.3 x 12.2 cm = volume: 993.5 mL. 9.7 x 9.3 x 7.5 cm intramural fibroid seen positioned at the uterine fundus. 6.0 x 5.8 x 5.4 cm intramural fibroid positioned at the left lower uterine segment. Endometrium Thickness: 8.5 mm.  No focal abnormality visualized. Right ovary Measurements: 3.8 x 2.1 x 2.4 cm = volume: 10.1 mL. Normal appearance/no adnexal mass. Left ovary Measurements: 4.5 x 1.6 x 3.3 cm = volume: 11.9 mL. Normal appearance/no adnexal mass. Other findings No abnormal free fluid. IMPRESSION: 1. Enlarged fibroid uterus as detailed above. 2. Endometrial stripe measures 8.5 mm in thickness. If bleeding remains unresponsive to hormonal or medical therapy, sonohysterogram should be considered for focal lesion work-up. (Ref: Radiological Reasoning: Algorithmic Workup of Abnormal Vaginal Bleeding with Endovaginal Sonography and Sonohysterography. AJR 2008; 025:E52-77). 3. Normal sonographic appearance of the ovaries. No adnexal mass or free fluid. Electronically Signed   By: Jeannine Boga M.D.   On: 06/27/2020 19:37    A/P 55 yo with fibroid uterus and menorrhagia with regular cycles - Discussed continue medical management with Lupron - Discussed surgical management with RFU, [place] - Patient is interested in all options. She is currently self pay but has applied for [facility] financial assistance.  - Lupron  form completed and patient will apply for scholarship. Patient referred to IR for [place]. Patient to see Dr. Elgie Congo next available for RFU consultation

## 2020-07-04 ENCOUNTER — Telehealth: Payer: Self-pay

## 2020-07-04 NOTE — Telephone Encounter (Signed)
Patient has follow up appointment to see Dr.Bass on 04/18

## 2020-07-04 NOTE — Telephone Encounter (Signed)
-----   Message from Mora Bellman, MD sent at 06/16/2020  8:36 AM EDT ----- Regarding: RE: GYN - vaginal bleeding Is she my patient? I don't see that I have seen her in the past   She certainly can be seen sooner with any other MD (other than Dr. Jodi Mourning as he does not offer surgical consultation). She can discontinue Megace, however, I would advise her to restart if the flow becomes heavy again  Thanks  Peggy ----- Message ----- From: Hollice Gong, CMA Sent: 06/15/2020   5:34 PM EDT To: Mora Bellman, MD Subject: GYN - vaginal bleeding                         Good evening Dr. Elly Modena,  I consulted with Dr. Elgie Congo today regarding this patient. She is coming to see you on 4/7 for ablation consultation and possible EBx after her ultrasound on 06/27/20. [facility] ER found fibroid on 06/12/20 CT scan. She voiced frustration with Megace and is requesting a different prescription to help stop her bleeding. When I gave her Dr. Elgie Congo' recommendation to continue it, she told me she thinks the Megace is making her bleeding worst. She said she did not take Megace today and the bleeding changed from flowing like a period to spotting. Pt denied heavy bleeding which I told her that means Megace is working :) Dr. Elgie Congo recommended that I reached out to you just to see if you wanted her to be seen sooner?     CT Scan on 06/12/20 IMPRESSION:  1. Enlarged uterus with multiple heterogeneous masses measuring up to 9.5 cm likely representing fibroid uterus. Further evaluation can be obtained with nonemergent pelvic ultrasound as indicated.  2. 1.5 cm low-density of the left adnexa. Recommendation: Probably benign appearing adnexal cyst of less than 3cm. For an early postmenopausal patient (50-52yrs) no follow up is necessary. Koleen [employer]rod [employer]- Sept 2013).

## 2020-07-06 ENCOUNTER — Encounter: Payer: Self-pay | Admitting: Obstetrics and Gynecology

## 2020-07-06 ENCOUNTER — Other Ambulatory Visit: Payer: Self-pay

## 2020-07-06 ENCOUNTER — Other Ambulatory Visit (HOSPITAL_COMMUNITY)
Admission: RE | Admit: 2020-07-06 | Discharge: 2020-07-06 | Disposition: A | Discharge status: Home / Self Care | Payer: Self-pay | Source: Ambulatory Visit | Attending: Obstetrics and Gynecology | Admitting: Obstetrics and Gynecology

## 2020-07-06 ENCOUNTER — Ambulatory Visit: Payer: Self-pay | Admitting: Obstetrics and Gynecology

## 2020-07-06 VITALS — BP 129/84 | HR 108 | Ht 64.0 in | Wt 185.0 lb

## 2020-07-06 DIAGNOSIS — N939 Abnormal uterine and vaginal bleeding, unspecified: Secondary | ICD-10-CM | POA: Insufficient documentation

## 2020-07-06 DIAGNOSIS — Z01812 Encounter for preprocedural laboratory examination: Secondary | ICD-10-CM

## 2020-07-06 DIAGNOSIS — N924 Excessive bleeding in the premenopausal period: Secondary | ICD-10-CM

## 2020-07-06 LAB — POCT URINE PREGNANCY: Preg Test, Ur: NEGATIVE

## 2020-07-06 MED ORDER — TRANEXAMIC ACID 650 MG PO TABS: 1300.0000 mg | ORAL_TABLET | Freq: Three times a day (TID) | ORAL | 2 refills | Status: DC

## 2020-07-06 NOTE — Patient Instructions (Signed)

## 2020-07-06 NOTE — Progress Notes (Signed)
CC: follow up dysfunctional uterine bleeding Subjective:    Patient ID: Gabrielle Hines, female    DOB: 1966-01-26, 55 y.o.   MRN: [id number]  HPI 55 yo G5P5 seen as follow up to discuss treatment options.  Advised pt that lupron would be short term and generally is used a a presurgical treatment for bleeding and decrease in fibroid size.  Discussed uterine artery embolization versus Acessa/Sonata procedure.  Discussed the treatments are independent of each other and is an either or proposition.  Review of chart showed no endometrial biopsy and pt was consented for the procedure.  Pt has appointment with interventional radiology on 4/19   ENDOMETRIAL BIOPSY      HARUYE LAINEZ is a 55 y.o. [id number] here for endometrial biopsy.  The indications for endometrial biopsy were reviewed.  Risks of the biopsy including cramping, bleeding, infection, uterine perforation, inadequate specimen and need for additional procedures were discussed. The patient states she understands and agrees to undergo procedure today. Consent was signed. Time out was performed.   Indications: dysfunctional uterine bleeding Urine HCG: negative, pt also has BTL  A bivalve speculum was placed into the vagina and the cervix was easily visualized and was prepped with Betadine x2. A single-toothed tenaculum was placed on the anterior lip of the cervix to stabilize it. The 3 mm pipelle was introduced into the endometrial cavity without difficulty to a depth of 13 cm, and a moderate amount of tissue was obtained and sent to pathology. This was repeated for a total of 3 passes. The instruments were removed from the patient's vagina. Moderate bleeding was notedfrom the cervix as previous. Tenaculum sites were hemostatic  The patient tolerated the procedure well. Routine post-procedure instructions were given to the patient.    Will base further management on results of biopsy.     Review of Systems     Objective:   Physical  Exam Vitals:   07/06/20 1428  BP: 129/84  Pulse: (!) 108   CLINICAL DATA:  Initial evaluation for abnormal uterine bleeding. Premenopausal.   EXAM: TRANSABDOMINAL AND TRANSVAGINAL ULTRASOUND OF PELVIS   TECHNIQUE: Both transabdominal and transvaginal ultrasound examinations of the pelvis were performed. Transabdominal technique was performed for global imaging of the pelvis including uterus, ovaries, adnexal regions, and pelvic cul-de-sac. It was necessary to proceed with endovaginal exam following the transabdominal exam to visualize the uterus, endometrium, and ovaries.   COMPARISON:  Prior CT from 07/16/2018   FINDINGS: Uterus   Measurements: 16.8 x 9.3 x 12.2 cm = volume: 993.5 mL. 9.7 x 9.3 x 7.5 cm intramural fibroid seen positioned at the uterine fundus. 6.0 x 5.8 x 5.4 cm intramural fibroid positioned at the left lower uterine segment.   Endometrium   Thickness: 8.5 mm.  No focal abnormality visualized.   Right ovary   Measurements: 3.8 x 2.1 x 2.4 cm = volume: 10.1 mL. Normal appearance/no adnexal mass.   Left ovary   Measurements: 4.5 x 1.6 x 3.3 cm = volume: 11.9 mL. Normal appearance/no adnexal mass.   Other findings   No abnormal free fluid.   IMPRESSION: 1. Enlarged fibroid uterus as detailed above. 2. Endometrial stripe measures 8.5 mm in thickness. If bleeding remains unresponsive to hormonal or medical therapy, sonohysterogram should be considered for focal lesion work-up. (Ref: Radiological Reasoning: Algorithmic Workup of Abnormal Vaginal Bleeding with Endovaginal Sonography and Sonohysterography. AJR 2008; 373:S28-76). 3. Normal sonographic appearance of the ovaries. No adnexal mass or free fluid.  Assessment & Plan:   1. Abnormal uterine bleeding (AUB) Will start lysteda to help decrease bleeding. After pt has her meeting with interventional radiology, will have virtual visit in 3 weeks to discuss her choice of  treatment. - POCT urine pregnancy - Surgical pathology( [place]/ [facility]) - tranexamic acid (LYSTEDA) 650 MG TABS tablet; Take 2 tablets (1,300 mg total) by mouth 3 (three) times daily. Take during menses for a maximum of five days  Dispense: 30 tablet; Refill: 2  2. Abnormal perimenopausal bleeding   I spent 10 minutes dedicated to the care of this patient including previsit review of records, face to face time with the patient discussing treatment options, plan and post visit testing.  The majority of the visit was discussion of treatment options and techniques.     Griffin Basil, MD Faculty Attending, [facility] for [facility]

## 2020-07-06 NOTE — Progress Notes (Signed)
Pt is here for surgical consult. Also would like to discuss options for something to stop bleeding while waiting on Lupron. Pt is becoming severely anemic. Earliest Lupron would be available for patient is next week. Pt is wearing both pad and tampon and changing every 2 hours and is saturated through both. Pt states both painful and passing golf ball size clots.

## 2020-07-11 ENCOUNTER — Ambulatory Visit: Payer: Self-pay | Admitting: Obstetrics and Gynecology

## 2020-07-11 LAB — SURGICAL PATHOLOGY

## 2020-07-12 ENCOUNTER — Other Ambulatory Visit: Payer: Self-pay | Admitting: Interventional Radiology

## 2020-07-12 ENCOUNTER — Encounter: Payer: Self-pay | Admitting: Interventional Radiology

## 2020-07-12 ENCOUNTER — Ambulatory Visit
Admission: RE | Admit: 2020-07-12 | Discharge: 2020-07-12 | Disposition: A | Discharge status: Home / Self Care | Payer: Self-pay | Source: Ambulatory Visit | Attending: Obstetrics and Gynecology | Admitting: Obstetrics and Gynecology

## 2020-07-12 DIAGNOSIS — D250 Submucous leiomyoma of uterus: Secondary | ICD-10-CM

## 2020-07-12 HISTORY — PX: IR RADIOLOGIST EVAL & MGMT: IMG5224

## 2020-07-12 NOTE — Consult Note (Signed)
Chief Complaint: Patient was seen in consultation today for symptomatic uterine fibroids at the request of Constant,Peggy  Referring Physician(s): Constant,Peggy  History of Present Illness: Gabrielle Hines is a 55 y.o. female who describes at least 2 years of heavy menstrual cycles resulting in  anemia at one point requiring transfusion.  She  has been on iron supplementation for greater than 2 years. February 2022 she experienced markedly heavy cycles, and was started on Megace, which has resulted in fairly constant uterine bleeding such that it is unclear when she is actually having her cycle.  She is G5 P4 A1 with no plans for future pregnancy.  She said no previous fibroid surgeries or therapies.  No history of gynecologic infections. 05/12/2020 negative Pap smear 07/06/2020 negative endometrial biopsy She describes only mild pelvic bulk symptoms. Past Medical History:  Diagnosis Date  . Abnormal uterine bleeding (AUB)   . Anemia   . Fibroid   . Gastric ulcer   . History of anemia 1985  . History of blood transfusion 1983  . Vaginal Pap smear, abnormal     Past Surgical History:  Procedure Laterality Date  . BREAST ENHANCEMENT SURGERY  2002  . BREAST SURGERY    . CHOLECYSTECTOMY     Age 59  . GALLBLADDER SURGERY      Allergies: Patient has no known allergies.  Medications: Prior to Admission medications   Medication Sig Start Date End Date Taking? Authorizing Provider  alum & mag hydroxide-simeth (MAALOX/MYLANTA) 200-200-20 MG/5ML suspension Take 15 mLs by mouth every 6 (six) hours as needed for indigestion or heartburn.    [provider]  ferrous sulfate 325 (65 FE) MG tablet Take 1 tablet (325 mg total) by mouth daily with breakfast. 10/01/17 10/31/17  Donne Hazel, MD  ibuprofen (ADVIL,MOTRIN) 200 MG tablet Take 400 mg by mouth every 6 (six) hours as needed (pain).    [provider]  megestrol (MEGACE) 40 MG tablet Take 1 tablet (40 mg  total) by mouth 2 (two) times daily. Patient not taking: No sig reported 05/09/20   Leftwich-Kirby, Lattie Haw A, CNM  tranexamic acid (LYSTEDA) 650 MG TABS tablet Take 2 tablets (1,300 mg total) by mouth 3 (three) times daily. Take during menses for a maximum of five days 07/06/20   Griffin Basil, MD     Family History  Problem Relation Age of Onset  . Hypertension Mother   . Heart disease Mother   . Hyperlipidemia Mother   . Hearing loss Father   . Hyperlipidemia Father   . Cancer Maternal Grandfather   . Hypertension Other   . Cancer Other   . Hyperlipidemia Other     Social History   Socioeconomic History  . Marital status: Single    Spouse name: Not on file  . Number of children: 5  . Years of education: Not on file  . Highest education level: Not on file  Occupational History  . Occupation: Workforce  Tobacco Use  . Smoking status: Never Smoker  . Smokeless tobacco: Never Used  Vaping Use  . Vaping Use: Never used  Substance and Sexual Activity  . Alcohol use: Yes    Alcohol/week: 1.0 standard drink    Types: 1 Glasses of wine per week  . Drug use: No  . Sexual activity: Yes    Partners: Male    Birth control/protection: Surgical  Other Topics Concern  . Not on file  Social History Narrative  .  Not on file   Social Determinants of Health   Financial Resource Strain: Not on file  Food Insecurity: Not on file  Transportation Needs: Not on file  Physical Activity: Not on file  Stress: Not on file  Social Connections: Not on file    ECOG Status: 1 - Symptomatic but completely ambulatory  Review of Systems: A 12 point ROS discussed and pertinent positives are indicated in the HPI above.  All other systems are negative.  Review of Systems  Vital Signs: There were no vitals taken for this visit.  Physical Exam Constitutional: Oriented to person, place, and time. Well-developed and well-nourished. No distress.   HENT:  Head: Normocephalic and atraumatic.   Eyes: Conjunctivae and EOM are normal. Right eye exhibits no discharge. Left eye exhibits no discharge. No scleral icterus.  Neck: No JVD present.  Pulmonary/Chest: Effort normal. No stridor. No respiratory distress.  Abdomen: Obese, soft, non distended Neurological:  alert and oriented to person, place, and time.  Skin: Skin is warm and dry.  not diaphoretic.  Psychiatric:   normal mood and affect.   behavior is normal. Judgment and thought content normal.   Mallampati Score: Not assessed     Imaging: US PELVIC COMPLETE WITH TRANSVAGINAL  Result Date: 06/27/2020 CLINICAL DATA:  Initial evaluation for abnormal uterine bleeding. Premenopausal. EXAM: TRANSABDOMINAL AND TRANSVAGINAL ULTRASOUND OF PELVIS TECHNIQUE: Both transabdominal and transvaginal ultrasound examinations of the pelvis were performed. Transabdominal technique was performed for global imaging of the pelvis including uterus, ovaries, adnexal regions, and pelvic cul-de-sac. It was necessary to proceed with endovaginal exam following the transabdominal exam to visualize the uterus, endometrium, and ovaries. COMPARISON:  Prior CT from 07/16/2018 FINDINGS: Uterus Measurements: 16.8 x 9.3 x 12.2 cm = volume: 993.5 mL. 9.7 x 9.3 x 7.5 cm intramural fibroid seen positioned at the uterine fundus. 6.0 x 5.8 x 5.4 cm intramural fibroid positioned at the left lower uterine segment. Endometrium Thickness: 8.5 mm.  No focal abnormality visualized. Right ovary Measurements: 3.8 x 2.1 x 2.4 cm = volume: 10.1 mL. Normal appearance/no adnexal mass. Left ovary Measurements: 4.5 x 1.6 x 3.3 cm = volume: 11.9 mL. Normal appearance/no adnexal mass. Other findings No abnormal free fluid. IMPRESSION: 1. Enlarged fibroid uterus as detailed above. 2. Endometrial stripe measures 8.5 mm in thickness. If bleeding remains unresponsive to hormonal or medical therapy, sonohysterogram should be considered for focal lesion work-up. (Ref: Radiological Reasoning:  Algorithmic Workup of Abnormal Vaginal Bleeding with Endovaginal Sonography and Sonohysterography. AJR 2008; 253:G64-40). 3. Normal sonographic appearance of the ovaries. No adnexal mass or free fluid. Electronically Signed   By: Jeannine Boga M.D.   On: 06/27/2020 19:37   CT ABDOMEN AND PELVIS WITH CONTRAST  TECHNIQUE: Multidetector CT imaging of the abdomen and pelvis was performed using the standard protocol following bolus administration of intravenous contrast.  CONTRAST: 161mL OMNIPAQUE IOHEXOL 300 MG/ML SOLN  COMPARISON: 10/15/2007  FINDINGS: Lower chest: Left lower lobe nodule measures 3 mm on image 8. Lung bases otherwise clear. No effusions.  Hepatobiliary: No focal liver abnormality is seen. Status post cholecystectomy. No biliary dilatation.  Pancreas: No focal abnormality or ductal dilatation.  Spleen: No focal abnormality. Normal size.  Adrenals/Urinary Tract: No adrenal abnormality. No focal renal abnormality. No stones or hydronephrosis. Urinary bladder is unremarkable.  Stomach/Bowel: Normal appendix. Stomach, large and small bowel grossly unremarkable.  Vascular/Lymphatic: No evidence of aneurysm or adenopathy.  Reproductive: Enlarged fibroid uterus. Largest fibroid is in the right side of the  uterus measuring up to 7.3 cm. No adnexal masses.  Other: Trace free fluid in the pelvis. No free air.  Musculoskeletal: No acute bony abnormality.  IMPRESSION: Enlarged fibroid uterus.  Normal appendix.  No acute findings in the abdomen or pelvis.   Electronically Signed By: Rolm Baptise M.D. On: 07/16/2018 01:28   Labs:  CBC: 06/12/2020 hemoglobin 10.4, hematocrit 35.5   COAGS:  Ref Range & Units 1 mo ago  PT 11.8 - 14.3 second(s) 12.8   Comment: Many commonly administered drugs may affect the results obtained in PT and PTT testing.  INR See Therapeutic ranges 1.0   Comment: INR Therapeutic Range for DVT, PE:   2.0 - 3.0  INR Mechanical  Prosthetic Heart Valves: 2.5 - 3.5     BMP: 06/12/2020  Ref Range & Units 1 mo ago  Na 136 - 146 mmol/L 142   Potassium 3.7 - 5.4 mmol/L 4.3   Cl 97 - 108 mmol/L 109High   CO2 20 - 32 mmol/L 24   AGAP 7 - 16 mmol/L 9   Glucose 65 - 99 mg/dL 100High   BUN 6 - 24 mg/dL 9   Creatinine 0.57 - 1.00 mg/dL 0.96   Ca 8.7 - 10.2 mg/dL 9.0   ALK PHOS 25 - 150 U/L 69   T Bili 0.00 - 1.20 mg/dL 0.85   Total Protein 6.0 - 8.5 gm/dL 7.5   Alb 3.5 - 5.5 gm/dL 4.4   GLOBULIN 1.5 - 4.5 gm/dL 3.1   ALBUMIN/GLOBULIN RATIO 1.1 - 2.5 1.4   BUN/CREAT RATIO 11.0 - 26.0 9.4Low   ALT 0 - 40 U/L 12   AST 0 - 40 U/L 15   eGFR mL/min/1.59m2 71      LIVER FUNCTION TESTS: No results for input(s): BILITOT, AST, ALT, ALKPHOS, PROT, ALBUMIN in the last 8760 hours.  TUMOR MARKERS: No results for input(s): AFPTM, CEA, CA199, CHROMGRNA in the last 8760 hours.  Assessment and Plan:  My impression is that this patient's menorrhagia is  likely secondary to uterine fibroids. We spent the majority of the consultation discussing the pathophysiology of uterine leiomyomata, natural history, anticipated  involution post menopause, and treatment options. We reviewed her most recent CT, and the progression of her fibroid disease on serial scans since 2007. We discussed myomectomy, hysterectomy, and uterine fibroid embolization. I described the technique of UFE, anticipated benefits, possible risks and complications including but not limited to bleeding, infection, vessel damage, nontarget embolization, and incomplete symptom relief. We discussed the 90% clinical success rate historically and at our experience with UFE. We discussed the post procedure course and time course of symptom resolution. We discussed the need for continued gynecologic care.  She seemed to understand and did ask appropriate questions, which were answered.  Based on her evaluation thus far, I think she would be an appropriate candidate for  uterine fibroid embolization because of her symptomatology and  uterine fibroids. To complete her evaluation and workup, I would require pelvic MRI with contrast to best determine the exact site of location of her uterine fibroids, specifically to exclude any pedunculated fibroids on a narrow stalk, as well as to exclude any unexpected pelvic pathology.   After our discussion, the patient was motivated proceed. Accordingly, we can set up the MRI   at her convenience as an outpatient. If this looks okay, she may like to proceed with UFE.   Thank you for this interesting consult.  I greatly enjoyed meeting Agustina Caroli and  look forward to participating in their care.  A copy of this report was sent to the requesting provider on this date.  Electronically Signed: Rickard Rhymes 07/12/2020, 3:28 PM   I spent a total of  40 Minutes   in face to face in clinical consultation, greater than 50% of which was counseling/coordinating care for symptomatic uterine fibroids.

## 2020-07-26 ENCOUNTER — Encounter: Payer: Self-pay | Admitting: Obstetrics and Gynecology

## 2020-07-29 ENCOUNTER — Ambulatory Visit: Payer: Self-pay | Admitting: Obstetrics and Gynecology

## 2020-08-18 ENCOUNTER — Other Ambulatory Visit: Payer: Self-pay | Admitting: Obstetrics and Gynecology

## 2020-08-18 ENCOUNTER — Telehealth: Payer: Self-pay | Admitting: Obstetrics and Gynecology

## 2020-08-18 DIAGNOSIS — N939 Abnormal uterine and vaginal bleeding, unspecified: Secondary | ICD-10-CM

## 2020-08-18 NOTE — Telephone Encounter (Signed)
Patient called requesting a refill on Lysteda that was prescribed by Dr. Elgie Congo in April.  She states her bleeding is very heavy with excessive clots.   Please advise on refill request and/or need for virtual visit, etc.

## 2021-01-18 ENCOUNTER — Other Ambulatory Visit: Payer: Self-pay

## 2021-01-18 ENCOUNTER — Encounter: Payer: Self-pay | Admitting: Obstetrics and Gynecology

## 2021-01-18 ENCOUNTER — Ambulatory Visit (INDEPENDENT_AMBULATORY_CARE_PROVIDER_SITE_OTHER): Payer: Self-pay | Admitting: Obstetrics and Gynecology

## 2021-01-18 VITALS — BP 118/82 | HR 96 | Wt 186.0 lb

## 2021-01-18 DIAGNOSIS — N939 Abnormal uterine and vaginal bleeding, unspecified: Secondary | ICD-10-CM

## 2021-01-18 DIAGNOSIS — D219 Benign neoplasm of connective and other soft tissue, unspecified: Secondary | ICD-10-CM

## 2021-01-18 MED ORDER — TRANEXAMIC ACID 650 MG PO TABS: 1300.0000 mg | ORAL_TABLET | Freq: Three times a day (TID) | ORAL | 4 refills | Status: DC

## 2021-01-18 NOTE — Progress Notes (Signed)
  CC: fibroid treatment Subjective:    Patient ID: Gabrielle Hines, female    DOB: 1965/09/16, 55 y.o.   MRN: [id number]  HPI 55 yo G5P5 seen in [place] office to discuss possible treatment for AUB and fibroids.  Pt had normal endometrial biopsy previously.  She did see IR for consideration for [place].  She was considered a good candidate, would need MRI  prior to treatment.  Pt did not follow up due to concern about sedation/anesthesia.  Pt inquired again about lupron, this is not an option unless she is in preparation for surgery which she does not want.  Discussed ocp may not be a good option due to large fibroids.  She noted lysteda was somewhat effective, and she would like to use it again. Discussed myfembree and oriahnn as possible treatments as well.  She currently has no insurance, but info was given regarding patient assistance programs.   Review of Systems     Objective:   Physical Exam Vitals:   01/18/21 1358  BP: 118/82  Pulse: 96         Assessment & Plan:   1. Abnormal uterine bleeding (AUB) Will use lysteda a stopgap measure for now.  Pt will look into getting insurance and is considering a [place] agonist as treatment - tranexamic acid (LYSTEDA) 650 MG TABS tablet; Take 2 tablets (1,300 mg total) by mouth 3 (three) times daily. Take during menses for a maximum of five days  Dispense: 30 tablet; Refill: 4 - CBC  2. Fibroid Pt did not want any procedure where she would need anesthesia.  She was made aware most procedures at this point will need anesthesia for further treatment.  [place] is still an option, and this could possibly be done with sedation.    Pt is interested in [place] as treatments for symptomatic bleeding from fibroids.  She will inquire about no insurance options and if she qualifies we can order the medication.  - tranexamic acid (LYSTEDA) 650 MG TABS tablet; Take 2 tablets (1,300 mg total) by mouth 3 (three) times daily. Take during menses for a  maximum of five days  Dispense: 30 tablet; Refill: 4  F/u prn  Griffin Basil, MD Faculty Attending, [facility] for [facility]

## 2021-01-18 NOTE — Progress Notes (Signed)
Pt needs another rx for Lysteda or BCP for DUB.  Pt agrees to start Depo Lupron.  Pelvic u/s showed fibroids completed on 06/27/20

## 2021-01-19 LAB — CBC
Hematocrit: 31.6 % — ABNORMAL LOW (ref 34.0–46.6)
Hemoglobin: 8.7 g/dL — ABNORMAL LOW (ref 11.1–15.9)
MCH: 18.3 pg — ABNORMAL LOW (ref 26.6–33.0)
MCHC: 27.5 g/dL — ABNORMAL LOW (ref 31.5–35.7)
MCV: 66 fL — ABNORMAL LOW (ref 79–97)
Platelets: 424 10*3/uL (ref 150–450)
RBC: 4.76 x10E6/uL (ref 3.77–5.28)
RDW: 19.6 % — ABNORMAL HIGH (ref 11.7–15.4)
WBC: 8.8 10*3/uL (ref 3.4–10.8)

## 2021-01-23 ENCOUNTER — Ambulatory Visit: Payer: Self-pay | Admitting: Obstetrics and Gynecology

## 2021-02-01 NOTE — Addendum Note (Signed)
Addended by: Griffin Basil on: 02/01/2021 12:06 PM   Modules accepted: Orders

## 2021-02-03 NOTE — Progress Notes (Signed)
Orders in. Forwarded patient info to [facility] for iron infusion scheduling.

## 2021-02-13 ENCOUNTER — Inpatient Hospital Stay (HOSPITAL_COMMUNITY): Admission: RE | Admit: 2021-02-13 | Payer: Self-pay | Source: Ambulatory Visit

## 2021-07-01 ENCOUNTER — Ambulatory Visit (HOSPITAL_COMMUNITY)
Admission: EM | Admit: 2021-07-01 | Discharge: 2021-07-01 | Disposition: A | Discharge status: Home / Self Care | Payer: 59 | Attending: Nurse Practitioner | Admitting: Nurse Practitioner

## 2021-07-01 ENCOUNTER — Encounter (HOSPITAL_COMMUNITY): Payer: Self-pay | Admitting: *Deleted

## 2021-07-01 DIAGNOSIS — N912 Amenorrhea, unspecified: Secondary | ICD-10-CM

## 2021-07-01 DIAGNOSIS — Z8742 Personal history of other diseases of the female genital tract: Secondary | ICD-10-CM

## 2021-07-01 LAB — CBC
HCT: 28.1 % — ABNORMAL LOW (ref 36.0–46.0)
Hemoglobin: 7.1 g/dL — ABNORMAL LOW (ref 12.0–15.0)
MCH: 16 pg — ABNORMAL LOW (ref 26.0–34.0)
MCHC: 25.3 g/dL — ABNORMAL LOW (ref 30.0–36.0)
MCV: 63.4 fL — ABNORMAL LOW (ref 80.0–100.0)
Platelets: 509 10*3/uL — ABNORMAL HIGH (ref 150–400)
RBC: 4.43 MIL/uL (ref 3.87–5.11)
RDW: 21.3 % — ABNORMAL HIGH (ref 11.5–15.5)
WBC: 9.2 10*3/uL (ref 4.0–10.5)
nRBC: 0.3 % — ABNORMAL HIGH (ref 0.0–0.2)

## 2021-07-01 LAB — POC URINE PREG, ED: Preg Test, Ur: NEGATIVE

## 2021-07-01 NOTE — Discharge Instructions (Addendum)
-   you will see the results of your blood counts on Mychart later today or tomorrow ?- If they are still low, please call the Hematologist to set up an appointment for iron infusions  ?- Please follow up with your OB/GYN if you would like hormone testing  ?

## 2021-07-01 NOTE — ED Triage Notes (Addendum)
Pt reports hasn't had menstrual cycle this past month, wishes to have pregnancy test. Also requesting hemoglobin check due to hx anemia. Denies any pain or any sxs. Denies any concerns for STIs. ?

## 2021-07-01 NOTE — ED Provider Notes (Signed)
?[place] ? ? ? ?CSN: [id number] ?Arrival date & time: 07/01/21  1224 ? ? ?  ? ?History   ?Chief Complaint ?Chief Complaint  ?Patient presents with  ? Amenorrhea  ? Requesting lab tests  ? ? ?HPI ?Gabrielle Hines is a 56 y.o. female.  ? ?Patient presents today for amenorrhea more than 1 month and history of anemia.  She is requesting pregnancy test and her blood counts to be checked.  She does have some shortness of breath with exertion, pica cravings.  She has been set up for iron infusions in the past, however did not follow through.  She denies any chest pain, current shortness of breath, active bleeding.  She is sexually active and not concerned about STIs today. ? ? ?Past Medical History:  ?Diagnosis Date  ? Abnormal uterine bleeding (AUB)   ? Anemia   ? Fibroid   ? Gastric ulcer   ? History of anemia 1985  ? History of blood transfusion 1983  ? Vaginal Pap smear, abnormal   ? ? ?Patient Active Problem List  ? Diagnosis Date Noted  ? Fibroid   ? Abnormal perimenopausal bleeding 05/09/2020  ? Abnormal uterine bleeding (AUB) 05/09/2020  ? Vasovagal near-syncope 04/14/2013  ? Anemia 02/13/2013  ? ? ?Past Surgical History:  ?Procedure Laterality Date  ? BREAST ENHANCEMENT SURGERY  2002  ? BREAST SURGERY    ? CHOLECYSTECTOMY    ? Age 89  ? GALLBLADDER SURGERY    ? IR RADIOLOGIST EVAL & MGMT  07/12/2020  ? TUBAL LIGATION    ? ? ?OB History   ? ? Gravida  ?5  ? Para  ?4  ? Term  ?4  ? Preterm  ?0  ? AB  ?1  ? Living  ?5  ?  ? ? SAB  ?1  ? IAB  ?0  ? Ectopic  ?0  ? Multiple  ?1  ? Live Births  ?5  ?   ?  ?  ? ? ? ?Home Medications   ? ?Prior to Admission medications   ?Medication Sig Start Date End Date Taking? Authorizing Provider  ?alum & mag hydroxide-simeth (MAALOX/MYLANTA) 200-200-20 MG/5ML suspension Take 15 mLs by mouth every 6 (six) hours as needed for indigestion or heartburn.    [provider]  ?ferrous sulfate 325 (65 FE) MG tablet Take 1 tablet (325 mg total) by mouth daily with  breakfast. 10/01/17 10/31/17  Donne Hazel, MD  ?ibuprofen (ADVIL,MOTRIN) 200 MG tablet Take 400 mg by mouth every 6 (six) hours as needed (pain).    [provider]  ?megestrol (MEGACE) 40 MG tablet Take 1 tablet (40 mg total) by mouth 2 (two) times daily. ?Patient not taking: Reported on 06/30/2020 05/09/20   Elvera Maria, CNM  ?tranexamic acid (LYSTEDA) 650 MG TABS tablet Take 2 tablets (1,300 mg total) by mouth 3 (three) times daily. Take during menses for a maximum of five days 01/18/21   Griffin Basil, MD  ? ? ?Family History ?Family History  ?Problem Relation Age of Onset  ? Hypertension Mother   ? Heart disease Mother   ? Hyperlipidemia Mother   ? Hearing loss Father   ? Hyperlipidemia Father   ? Cancer Maternal Grandfather   ? Hypertension Other   ? Cancer Other   ? Hyperlipidemia Other   ? ? ?Social History ?Social History  ? ?Tobacco Use  ? Smoking status: Never  ? Smokeless tobacco: Never  ?Vaping Use  ?  Vaping Use: Never used  ?Substance Use Topics  ? Alcohol use: Yes  ?  Alcohol/week: 1.0 standard drink  ?  Types: 1 Glasses of wine per week  ? Drug use: No  ? ? ? ?Allergies   ?Patient has no known allergies. ? ? ?Review of Systems ?Review of Systems ?Per HPI ? ?Physical Exam ?Triage Vital Signs ?ED Triage Vitals  ?Enc Vitals Group  ?   BP 07/01/21 1313 136/77  ?   Pulse Rate 07/01/21 1313 86  ?   Resp 07/01/21 1313 16  ?   Temp 07/01/21 1313 98.9 ?F (37.2 ?C)  ?   Temp Source 07/01/21 1313 Oral  ?   SpO2 07/01/21 1313 99 %  ?   Weight --   ?   Height --   ?   Head Circumference --   ?   Peak Flow --   ?   Pain Score 07/01/21 1315 0  ?   Pain Loc --   ?   Pain Edu? --   ?   Excl. in [place]? --   ? ?No data found. ? ?Updated Vital Signs ?BP 136/77   Pulse 86   Temp 98.9 ?F (37.2 ?C) (Oral)   Resp 16   LMP 05/09/2021 (Exact Date)   SpO2 99%  ? ?Visual Acuity ?Right Eye Distance:   ?Left Eye Distance:   ?Bilateral Distance:   ? ?Right Eye Near:   ?Left Eye Near:    ?Bilateral Near:     ? ?Physical Exam ?Vitals and nursing note reviewed.  ?Constitutional:   ?   Appearance: Normal appearance.  ?HENT:  ?   Head: Normocephalic and atraumatic.  ?Eyes:  ?   Comments: Conjunctivae pale bilaterally  ?Cardiovascular:  ?   Rate and Rhythm: Normal rate and regular rhythm.  ?Pulmonary:  ?   Effort: Pulmonary effort is normal. No respiratory distress.  ?   Breath sounds: Normal breath sounds. No wheezing, rhonchi or rales.  ?Musculoskeletal:  ?   Right lower leg: No edema.  ?   Left lower leg: No edema.  ?Skin: ?   General: Skin is warm and dry.  ?   Coloration: Skin is not jaundiced or pale.  ?   Findings: No erythema.  ?Neurological:  ?   Mental Status: She is alert and oriented to person, place, and time.  ?Psychiatric:     ?   Mood and Affect: Mood normal.     ?   Behavior: Behavior normal.  ? ? ? ?UC Treatments / Results  ?Labs ?(all labs ordered are listed, but only abnormal results are displayed) ?Labs Reviewed  ?CBC  ?POC URINE PREG, ED  ? ? ?EKG ? ? ?Radiology ?No results found. ? ?Procedures ?Procedures (including critical care time) ? ?Medications Ordered in UC ?Medications - No data to display ? ?Initial Impression / Assessment and Plan / UC Course  ?I have reviewed the triage vital signs and the nursing notes. ? ?Pertinent labs & imaging results that were available during my care of the patient were reviewed by me and considered in my medical decision making (see chart for details). ? ?  ?UPT negative today.  Will check CBC per patient request.  I encouraged her to follow up with Hematology if she remains anemia.  We also discussed perimenopause and I explained that she is not in menopause until 1 full calender year without a menses.  Can follow up with OB/GYN if she desires further  hormone testing.  ?Final Clinical Impressions(s) / UC Diagnoses  ? ?Final diagnoses:  ?Amenorrhea  ?History of menorrhagia  ? ? ? ?Discharge Instructions   ? ?  ?- you will see the results of your blood counts on  [facility] later today or tomorrow ?- If they are still low, please call the Hematologist to set up an appointment for iron infusions  ?- Please follow up with your OB/GYN if you would like hormone testing  ? ? ? ? ?ED Prescriptions   ?None ?  ? ?PDMP not reviewed this encounter. ?  ?Eulogio Bear, NP ?07/01/21 1444 ? ?

## 2021-07-27 ENCOUNTER — Ambulatory Visit (INDEPENDENT_AMBULATORY_CARE_PROVIDER_SITE_OTHER): Payer: 59 | Admitting: Obstetrics and Gynecology

## 2021-07-27 ENCOUNTER — Other Ambulatory Visit (HOSPITAL_COMMUNITY)
Admission: RE | Admit: 2021-07-27 | Discharge: 2021-07-27 | Disposition: A | Discharge status: Home / Self Care | Payer: 59 | Source: Ambulatory Visit | Attending: Obstetrics and Gynecology | Admitting: Obstetrics and Gynecology

## 2021-07-27 ENCOUNTER — Encounter: Payer: Self-pay | Admitting: Obstetrics and Gynecology

## 2021-07-27 VITALS — BP 144/77 | HR 93 | Ht 64.0 in | Wt 164.5 lb

## 2021-07-27 DIAGNOSIS — N898 Other specified noninflammatory disorders of vagina: Secondary | ICD-10-CM | POA: Diagnosis not present

## 2021-07-27 DIAGNOSIS — N912 Amenorrhea, unspecified: Secondary | ICD-10-CM | POA: Diagnosis not present

## 2021-07-27 DIAGNOSIS — Z113 Encounter for screening for infections with a predominantly sexual mode of transmission: Secondary | ICD-10-CM

## 2021-07-27 DIAGNOSIS — N924 Excessive bleeding in the premenopausal period: Secondary | ICD-10-CM

## 2021-07-27 NOTE — Progress Notes (Signed)
?  CC: amenorrhea ?Subjective:  ? ? Patient ID: Gabrielle Hines, female    DOB: 1965-10-02, 56 y.o.   MRN: [id number] ? ?HPI ?56 yo G5P5 seen for report of amenorrhea since 05/11/21.  When interviewed pt states she actually has had very light spotting or pink discharge when she wipes.  She denies any vasomotor symptoms.  Previously, pt had known large fibroids and she had used TXA and megace in the past.  [place] agonists had been discussed as well as UFE.  Pt never pursued these treatments.  She was seen in the ER due to amenorrhea 4/23, and even though she has not been bleeding her hbg was noted to be 7.1.  Pt was scheduled for iron infusion but she missed the appointment. ? ? ?Review of Systems ? ?   ?Objective:  ? Physical Exam ?Vitals:  ? 07/27/21 1624  ?BP: (!) 144/77  ?Pulse: 93  ? ? ? ? ?   ?Assessment & Plan:  ? ?1. Amenorrhea ?Will check [place] to see if patient has transitioned into menopause ?- Follicle stimulating hormone ? ?2. Abnormal perimenopausal bleeding ? ?- Follicle stimulating hormone ? ?3. Routine screening for STI (sexually transmitted infection) ?Per pt request with self swab ? ?4. Vaginal odor ?See above ?- Cervicovaginal ancillary only ? ?Will assist patient in rescheduling iron infusion,  ?F/u in 6 months with virtual visit ? ?I spent 20 minutes dedicated to the care of this patient including previsit review of records, face to face time with the patient discussing current symptoms and labs and post visit testing.  ? ?Griffin Basil, MD ?Faculty Attending, [facility] for [facility]  ?

## 2021-07-27 NOTE — Progress Notes (Signed)
Pt states she as not had a cycle since 05-11-21. She is having some bleeding not but bleeding is light with no flow. Pt only sees blood when wiping. Pt c/o of vaginal odor and is requesting STD testing today. ?

## 2021-07-28 LAB — FOLLICLE STIMULATING HORMONE: FSH: 2 m[IU]/mL

## 2021-07-31 LAB — CERVICOVAGINAL ANCILLARY ONLY
Bacterial Vaginitis (gardnerella): POSITIVE — AB
Candida Glabrata: NEGATIVE
Candida Vaginitis: POSITIVE — AB
Chlamydia: NEGATIVE
Comment: NEGATIVE
Comment: NEGATIVE
Comment: NEGATIVE
Comment: NEGATIVE
Comment: NEGATIVE
Comment: NORMAL
Neisseria Gonorrhea: NEGATIVE
Trichomonas: NEGATIVE

## 2021-08-01 ENCOUNTER — Telehealth: Payer: Self-pay

## 2021-08-01 MED ORDER — METRONIDAZOLE 500 MG PO TABS: 500.0000 mg | ORAL_TABLET | Freq: Two times a day (BID) | ORAL | 0 refills | Status: DC

## 2021-08-01 MED ORDER — FLUCONAZOLE 150 MG PO TABS
150.0000 mg | ORAL_TABLET | Freq: Once | ORAL | 0 refills | Status: AC
Start: 2021-08-01 — End: 2021-08-01

## 2021-08-01 NOTE — Telephone Encounter (Signed)
S/w pt and advised of results and rx sent per protocol ?

## 2021-08-04 ENCOUNTER — Other Ambulatory Visit: Payer: Self-pay | Admitting: Obstetrics and Gynecology

## 2021-08-04 NOTE — Progress Notes (Signed)
Venofer signed and hold orders placed ?

## 2021-08-07 ENCOUNTER — Encounter (HOSPITAL_COMMUNITY)
Admission: RE | Admit: 2021-08-07 | Discharge: 2021-08-07 | Disposition: A | Discharge status: Home / Self Care | Payer: 59 | Source: Ambulatory Visit | Attending: Obstetrics and Gynecology | Admitting: Obstetrics and Gynecology

## 2021-08-07 DIAGNOSIS — N924 Excessive bleeding in the premenopausal period: Secondary | ICD-10-CM | POA: Insufficient documentation

## 2021-08-07 DIAGNOSIS — N912 Amenorrhea, unspecified: Secondary | ICD-10-CM | POA: Diagnosis not present

## 2021-08-07 MED ORDER — SODIUM CHLORIDE 0.9 % IV SOLN
500.0000 mg | Freq: Once | INTRAVENOUS | Status: AC
  Administered 2021-08-07: 500 mg via INTRAVENOUS
  Filled 2021-08-07: qty 500

## 2021-12-05 IMAGING — US US PELVIS COMPLETE WITH TRANSVAGINAL
1 series · 15 of 25 positions shown · non-contrast
Comparison: Prior CT from 07/16/2018

CLINICAL DATA: Initial evaluation for abnormal uterine bleeding.
Premenopausal.



[Series 1: us pelvis complete with transvaginal · 15 of 139 slices shown]
[im 1/139]
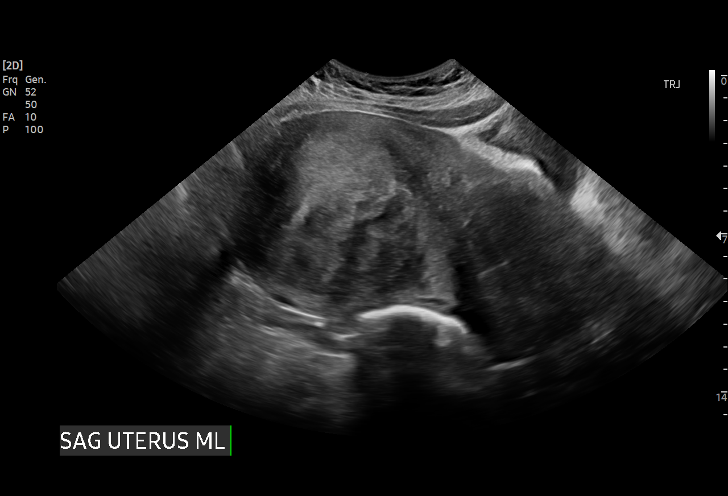
[im 12/139]
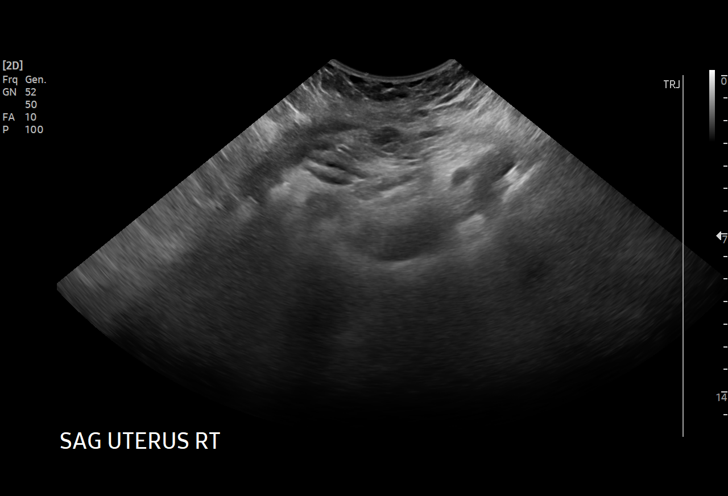
[im 24/139]
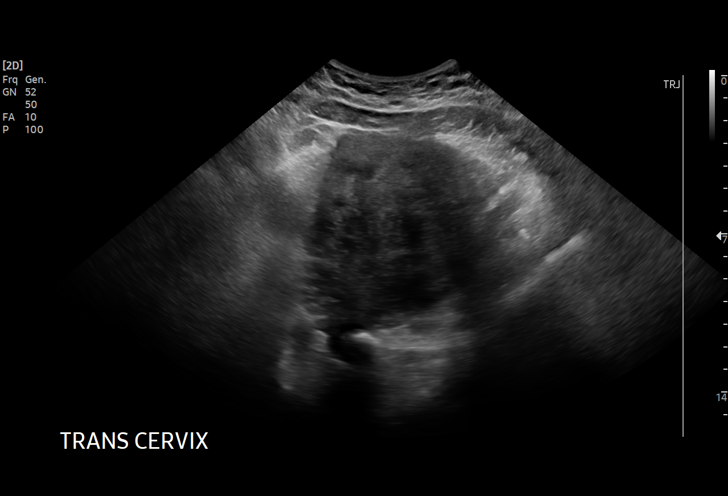
[im 29/139]
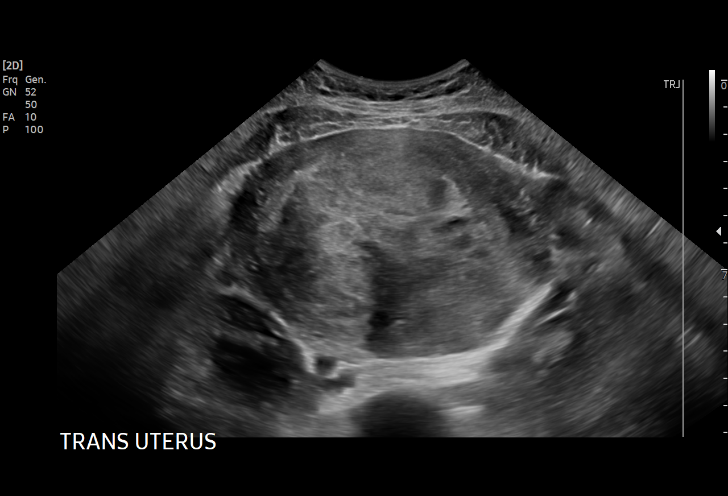
[im 41/139]
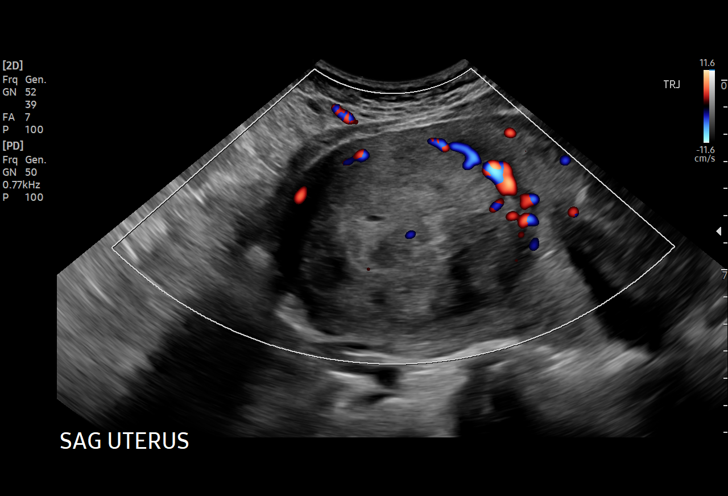
[im 52/139]
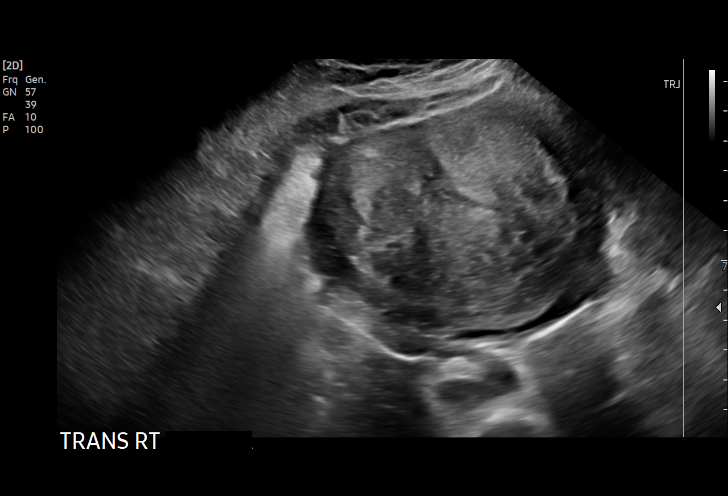
[im 58/139]
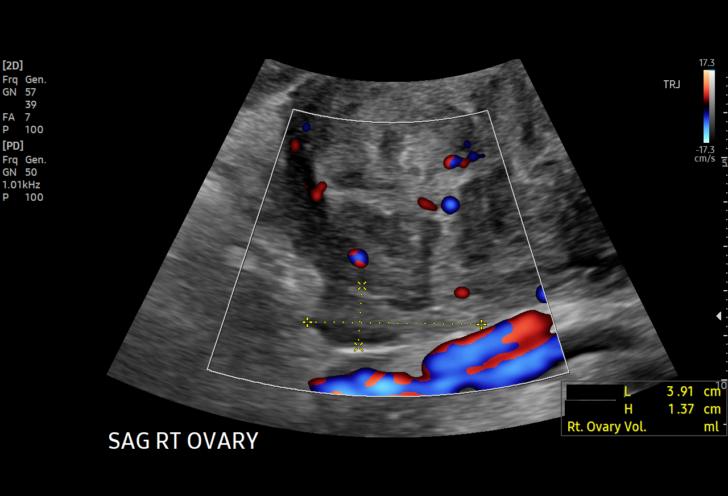
[im 70/139]
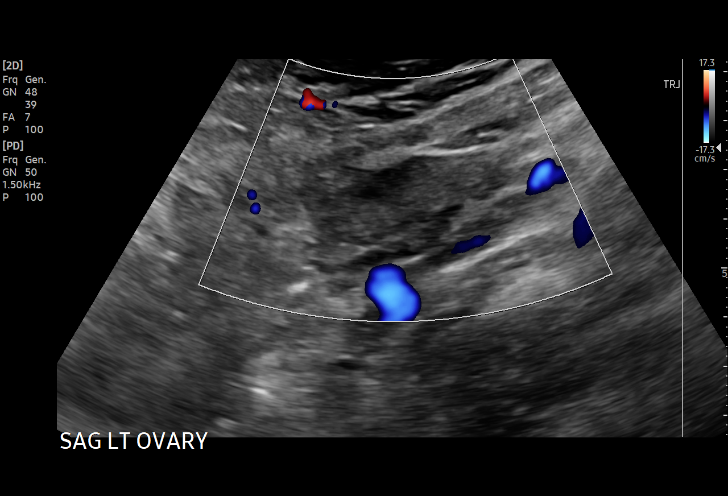
[im 81/139]
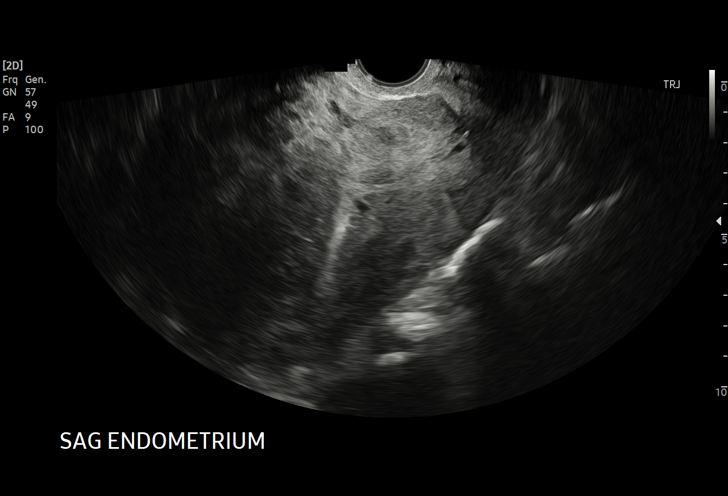
[im 87/139]
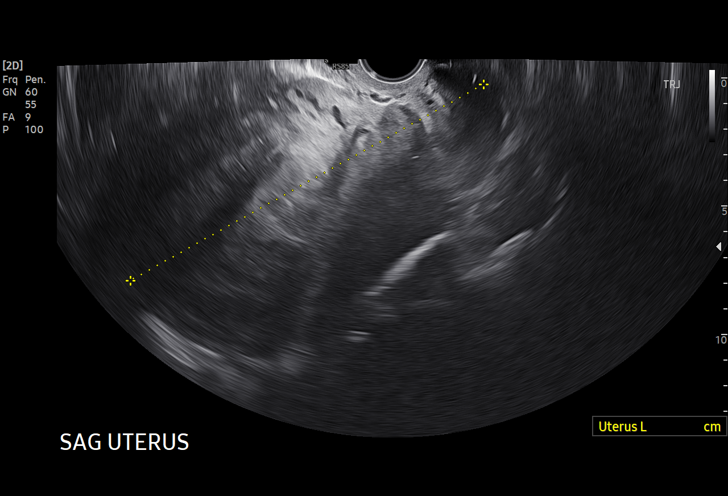
[im 98/139]
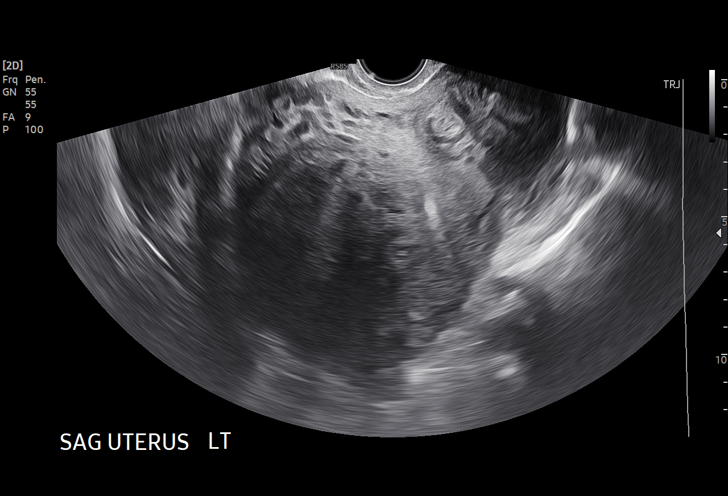
[im 110/139]
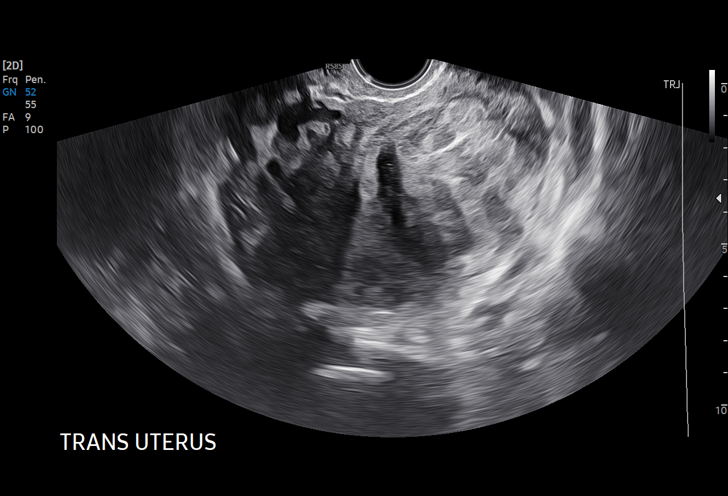
[im 116/139]
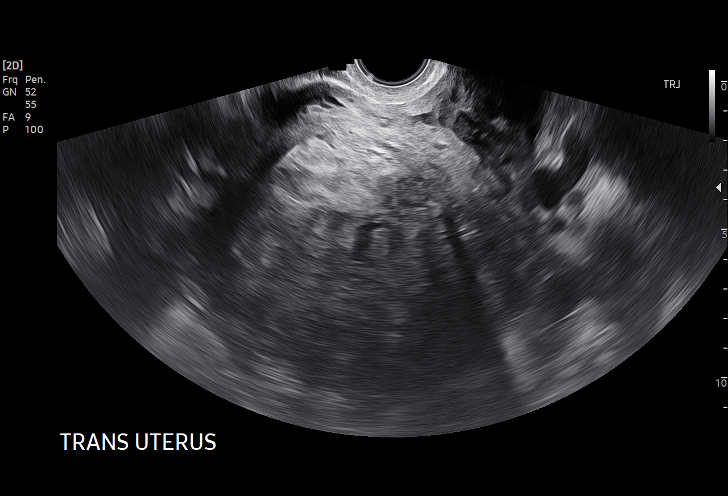
[im 127/139]
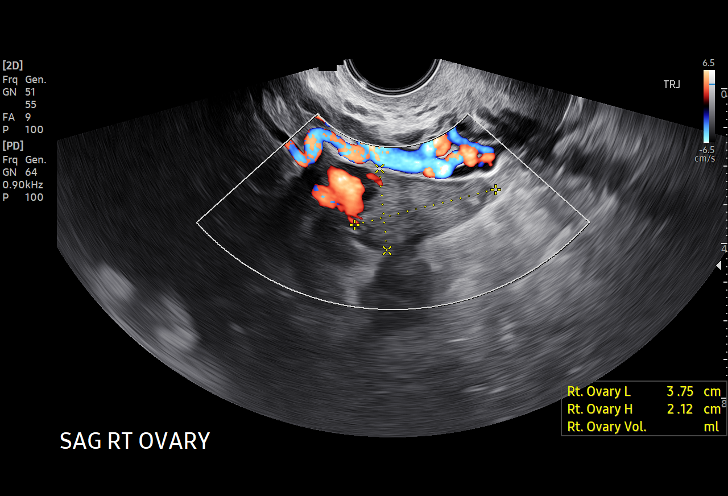
[im 139/139]
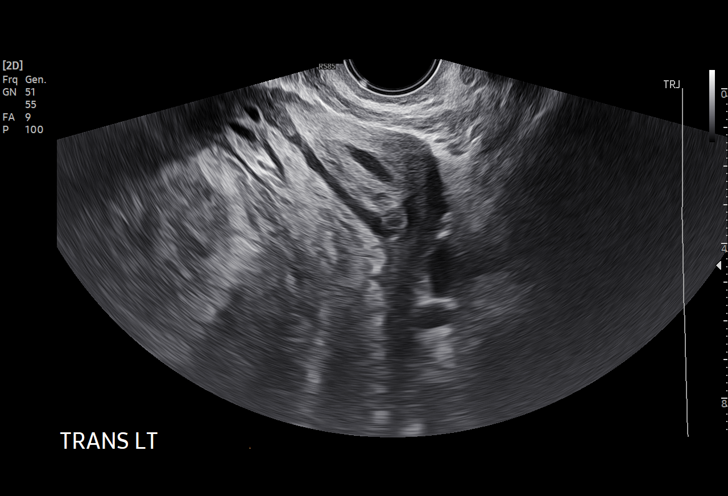

[15 of 25 positions shown; findings below may reference images not displayed]

FINDINGS: Uterus

Measurements: 16.8 x 9.3 x 12.2 cm = volume: 993.5 mL. 9.7 x 9.3 x
7.5 cm intramural fibroid seen positioned at the uterine fundus.
x 5.8 x 5.4 cm intramural fibroid positioned at the left lower
uterine segment.

Endometrium

Thickness: 8.5 mm.  No focal abnormality visualized.

Right ovary

Measurements: 3.8 x 2.1 x 2.4 cm = volume: 10.1 mL. Normal
appearance/no adnexal mass.

Left ovary

Measurements: 4.5 x 1.6 x 3.3 cm = volume: 11.9 mL. Normal
appearance/no adnexal mass.

Other findings

No abnormal free fluid.
IMPRESSION: 1. Enlarged fibroid uterus as detailed above.
2. Endometrial stripe measures 8.5 mm in thickness. If bleeding
remains unresponsive to hormonal or medical therapy, sonohysterogram
should be considered for focal lesion work-up. (Ref: Radiological
Reasoning: Algorithmic Workup of Abnormal Vaginal Bleeding with
Endovaginal Sonography and Sonohysterography. AJR 1887; 191:S68-73).
3. Normal sonographic appearance of the ovaries. No adnexal mass or
free fluid.

## 2022-02-01 ENCOUNTER — Encounter: Payer: Self-pay | Admitting: Obstetrics and Gynecology

## 2022-02-01 ENCOUNTER — Ambulatory Visit (INDEPENDENT_AMBULATORY_CARE_PROVIDER_SITE_OTHER): Payer: Self-pay | Admitting: Obstetrics and Gynecology

## 2022-02-01 VITALS — BP 137/75 | HR 90 | Ht 64.0 in | Wt 163.7 lb

## 2022-02-01 DIAGNOSIS — Z1231 Encounter for screening mammogram for malignant neoplasm of breast: Secondary | ICD-10-CM

## 2022-02-01 DIAGNOSIS — N924 Excessive bleeding in the premenopausal period: Secondary | ICD-10-CM

## 2022-02-01 DIAGNOSIS — D649 Anemia, unspecified: Secondary | ICD-10-CM

## 2022-02-01 DIAGNOSIS — D219 Benign neoplasm of connective and other soft tissue, unspecified: Secondary | ICD-10-CM

## 2022-02-01 NOTE — Progress Notes (Signed)
Pt c/o AUB. Pt states periods are very light. Periods come monthly and last for 7-8 days. Pt feels iron may be low.

## 2022-02-01 NOTE — Progress Notes (Signed)
  CC: perimenopausal bleeding follow up Subjective:    Patient ID: Gabrielle Hines, female    DOB: 10-01-1965, 56 y.o.   MRN: [id number]  HPI 56 yo G5P5 seen for follow up of perimenopausal bleeding.  Pt states after last eval she had 2-3 months of heavy regular menses.  October and November her menses have been very light, almost at the level of spotting.  She is concerned that her anemia may have worsened due to increased pica and the bleeding she had over the summer.  Pt was previously treated with iron infusion and felt much better after treatment.   Review of Systems     Objective:   Physical Exam Vitals:   02/01/22 1503  BP: 137/75  Pulse: 90         Assessment & Plan:   1. Anemia, unspecified type Pt had iron infusion May of 2023 and then had 2-3 months of heavy bleeding, she requests check on h/h - CBC  2. Abnormal perimenopausal bleeding Last [facility] did not reflect menopause in May Pt only notes light regular bleeding the last 2 months Expectant management for now  3. Fibroid If heavy bleeding resumes, UFE would be the best minimally invasive option for treatment  4. Visit for screening mammogram Pt has not had mammogram since 2014, mammogram ordered - MM 3D SCREEN BREAST BILATERAL; Future  F/u in 6 months for Annual exam  Griffin Basil, MD Faculty Attending, Center for [facility]

## 2022-02-02 LAB — CBC
Hematocrit: 28.3 % — ABNORMAL LOW (ref 34.0–46.6)
Hemoglobin: 7.1 g/dL — ABNORMAL LOW (ref 11.1–15.9)
MCH: 16.2 pg — ABNORMAL LOW (ref 26.6–33.0)
MCHC: 25.1 g/dL — ABNORMAL LOW (ref 31.5–35.7)
MCV: 65 fL — ABNORMAL LOW (ref 79–97)
Platelets: 365 10*3/uL (ref 150–450)
RBC: 4.39 x10E6/uL (ref 3.77–5.28)
RDW: 21.1 % — ABNORMAL HIGH (ref 11.7–15.4)
WBC: 8.6 10*3/uL (ref 3.4–10.8)

## 2022-02-06 ENCOUNTER — Other Ambulatory Visit: Payer: Self-pay | Admitting: Obstetrics and Gynecology

## 2022-02-06 NOTE — Progress Notes (Signed)
Infusion orders placed

## 2022-02-07 ENCOUNTER — Other Ambulatory Visit: Payer: Self-pay | Admitting: Obstetrics and Gynecology

## 2022-02-07 ENCOUNTER — Telehealth: Payer: Self-pay | Admitting: Pharmacy Technician

## 2022-02-07 ENCOUNTER — Telehealth: Payer: Self-pay | Admitting: Emergency Medicine

## 2022-02-07 ENCOUNTER — Ambulatory Visit (HOSPITAL_BASED_OUTPATIENT_CLINIC_OR_DEPARTMENT_OTHER)
Admission: RE | Admit: 2022-02-07 | Discharge: 2022-02-07 | Disposition: A | Discharge status: Home / Self Care | Payer: Self-pay | Source: Ambulatory Visit | Attending: Obstetrics and Gynecology | Admitting: Obstetrics and Gynecology

## 2022-02-07 DIAGNOSIS — N924 Excessive bleeding in the premenopausal period: Secondary | ICD-10-CM

## 2022-02-07 DIAGNOSIS — D219 Benign neoplasm of connective and other soft tissue, unspecified: Secondary | ICD-10-CM

## 2022-02-07 DIAGNOSIS — D649 Anemia, unspecified: Secondary | ICD-10-CM

## 2022-02-07 DIAGNOSIS — Z1231 Encounter for screening mammogram for malignant neoplasm of breast: Secondary | ICD-10-CM | POA: Insufficient documentation

## 2022-02-07 NOTE — Telephone Encounter (Signed)
Patient is un-insured and will need PAP (Free drug). Information has been sent to [employer], awaiting response.

## 2022-02-07 NOTE — Telephone Encounter (Signed)
TC to patient, discussed results. Pt informed of referral.

## 2022-02-07 NOTE — Telephone Encounter (Signed)
-----   Message from Griffin Basil, MD sent at 02/06/2022  4:05 PM EST ----- Anemia again noted, will place orders for iron infusion.  Office will schedule iron infusion.

## 2022-02-20 NOTE — Telephone Encounter (Addendum)
Forms has been faxed to Dr Elgie Congo for signature. (2nd request) Spoke w/ [employer]. Phone: [phone] Fax: [phone]

## 2022-03-02 ENCOUNTER — Other Ambulatory Visit: Payer: Self-pay | Admitting: Obstetrics and Gynecology

## 2022-03-02 DIAGNOSIS — N939 Abnormal uterine and vaginal bleeding, unspecified: Secondary | ICD-10-CM

## 2022-03-02 DIAGNOSIS — D219 Benign neoplasm of connective and other soft tissue, unspecified: Secondary | ICD-10-CM

## 2022-03-08 NOTE — Telephone Encounter (Signed)
Forms has been faxed for PAP. Awaiting response

## 2022-03-12 NOTE — Telephone Encounter (Signed)
F/u: PAP is still pending.  Was informed by program they have received numerous request for PAP, but are working to get patients processed as quickly as they can.   I  will f/u once I have a response/approval

## 2022-03-15 NOTE — Telephone Encounter (Signed)
Dr. Elgie Congo, Juluis Rainier note:  Auth Submission: APPROVED PAP FREE DRUG Payer: P[employer]- [employer] Medication & CPT/J Code(s) submitted: Venofer (Iron Sucrose) J1756 Route of submission (phone, fax, portal):  Phone [phone] Fax # Auth type: FREE DRUG Units/visits requested: x2 Reference number: [id number] Approval from: 02/23/22 to 02/23/23   Patient will be scheduled as soon as possible

## 2022-03-23 ENCOUNTER — Ambulatory Visit (INDEPENDENT_AMBULATORY_CARE_PROVIDER_SITE_OTHER): Payer: Self-pay

## 2022-03-23 VITALS — BP 105/61 | HR 72 | Temp 98.5°F | Resp 16 | Ht 64.0 in | Wt 165.4 lb

## 2022-03-23 DIAGNOSIS — N924 Excessive bleeding in the premenopausal period: Secondary | ICD-10-CM

## 2022-03-23 DIAGNOSIS — D500 Iron deficiency anemia secondary to blood loss (chronic): Secondary | ICD-10-CM

## 2022-03-23 MED ORDER — SODIUM CHLORIDE 0.9 % IV SOLN
500.0000 mg | Freq: Once | INTRAVENOUS | Status: AC
  Administered 2022-03-23: 500 mg via INTRAVENOUS
  Filled 2022-03-23: qty 25

## 2022-03-23 MED ORDER — ACETAMINOPHEN 325 MG PO TABS
650.0000 mg | ORAL_TABLET | Freq: Once | ORAL | Status: AC
  Administered 2022-03-23: 650 mg via ORAL
  Filled 2022-03-23: qty 2

## 2022-03-23 MED ORDER — DIPHENHYDRAMINE HCL 25 MG PO CAPS
25.0000 mg | ORAL_CAPSULE | Freq: Once | ORAL | Status: AC
  Administered 2022-03-23: 25 mg via ORAL
  Filled 2022-03-23: qty 1

## 2022-03-23 NOTE — Progress Notes (Cosign Needed)
Diagnosis: Iron Deficiency Anemia  Provider:  Marshell Garfinkel MD  Procedure: Infusion  IV Type: Peripheral, IV Location: R Forearm  Venofer (Iron Sucrose), Dose: 500 mg  Infusion Start Time: 4320  Infusion Stop Time: 1510  Post Infusion IV Care: Observation period completed and Peripheral IV Discontinued  Discharge: Condition: Good, Destination: Home . AVS provided to patient.   Performed by:  Adelina Mings, LPN

## 2022-04-06 ENCOUNTER — Ambulatory Visit: Payer: Self-pay

## 2022-04-06 MED ORDER — SODIUM CHLORIDE 0.9 % IV SOLN
500.0000 mg | Freq: Once | INTRAVENOUS | Status: AC
  Filled 2022-04-06: qty 25

## 2022-04-06 MED ORDER — DIPHENHYDRAMINE HCL 25 MG PO CAPS: 25.0000 mg | ORAL_CAPSULE | Freq: Once | ORAL | Status: AC

## 2022-04-06 MED ORDER — ACETAMINOPHEN 325 MG PO TABS: 650.0000 mg | ORAL_TABLET | Freq: Once | ORAL | Status: AC

## 2022-06-08 ENCOUNTER — Encounter: Payer: Self-pay | Admitting: Obstetrics and Gynecology

## 2022-06-20 ENCOUNTER — Encounter: Payer: Self-pay | Admitting: Obstetrics and Gynecology

## 2022-06-25 ENCOUNTER — Ambulatory Visit (INDEPENDENT_AMBULATORY_CARE_PROVIDER_SITE_OTHER): Payer: 59 | Admitting: Obstetrics

## 2022-06-25 ENCOUNTER — Other Ambulatory Visit (HOSPITAL_COMMUNITY)
Admission: RE | Admit: 2022-06-25 | Discharge: 2022-06-25 | Disposition: A | Discharge status: Home / Self Care | Payer: 59 | Source: Ambulatory Visit | Attending: Obstetrics | Admitting: Obstetrics

## 2022-06-25 ENCOUNTER — Encounter: Payer: Self-pay | Admitting: Obstetrics

## 2022-06-25 VITALS — BP 121/75 | HR 73 | Ht 64.0 in | Wt 171.0 lb

## 2022-06-25 DIAGNOSIS — R232 Flushing: Secondary | ICD-10-CM

## 2022-06-25 DIAGNOSIS — Z01419 Encounter for gynecological examination (general) (routine) without abnormal findings: Secondary | ICD-10-CM | POA: Insufficient documentation

## 2022-06-25 DIAGNOSIS — D508 Other iron deficiency anemias: Secondary | ICD-10-CM | POA: Diagnosis not present

## 2022-06-25 DIAGNOSIS — N951 Menopausal and female climacteric states: Secondary | ICD-10-CM | POA: Diagnosis not present

## 2022-06-25 MED ORDER — ALPRAZOLAM 0.25 MG PO TABS
0.2500 mg | ORAL_TABLET | Freq: Every evening | ORAL | 2 refills | Status: AC | PRN
Start: 2022-06-25 — End: ?

## 2022-06-25 MED ORDER — CLIMARA PRO 0.045-0.015 MG/DAY TD PTWK
1.0000 | MEDICATED_PATCH | TRANSDERMAL | 12 refills | Status: DC
Start: 2022-06-25 — End: 2022-06-28

## 2022-06-25 NOTE — Progress Notes (Unsigned)
Subjective:        Gabrielle Hines is a 57 y.o. female here for a routine exam.  Current complaints: Hot flashes and difficulty sleeping.  Also c/o vaginal discharge..    Personal health questionnaire:  Is patient Ashkenazi Jewish, have a family history of breast and/or ovarian cancer: {YES NO:22349} Is there a family history of uterine cancer diagnosed at age < 45, gastrointestinal cancer, urinary tract cancer, family member who is a Field seismologist syndrome-associated carrier: {YES NO:22349} Is the patient overweight and hypertensive, family history of diabetes, personal history of gestational diabetes, preeclampsia or PCOS: {YES NO:22349} Is patient over 53, have PCOS,  family history of premature CHD under age 19, diabetes, smoke, have hypertension or peripheral artery disease:  {YES NO:22349} At any time, has a partner hit, kicked or otherwise hurt or frightened you?: {YES NO:22349} Over the past 2 weeks, have you felt down, depressed or hopeless?: {YES NO:22349} Over the past 2 weeks, have you felt little interest or pleasure in doing things?:{M;yes/no/sometimes:15259}   Gynecologic History Patient's last menstrual period was 03/08/2022 (exact date). Contraception: {method:5051} Last Pap: ***. Results were: {norm/abn:16337} Last mammogram: ***. Results were: {norm/abn:16337}  Obstetric History OB History  Gravida Para Term Preterm AB Living  5 4 4  0 1 5  SAB IAB Ectopic Multiple Live Births  1 0 0 1 5    # Outcome Date GA Lbr Len/2nd Weight Sex Delivery Anes PTL Lv  5A Term 11/14/90 [redacted]w[redacted]d  4 lb 9 oz (2.07 kg) M Vag-Spont EPI  LIV  5B Term 11/14/90 [redacted]w[redacted]d  5 lb 6 oz (2.438 kg) F Vag-Spont EPI  LIV  4 Term 12/04/89 [redacted]w[redacted]d  6 lb 11 oz (3.033 kg) F Vag-Spont EPI  LIV  3 Term 01/11/89 [redacted]w[redacted]d  7 lb 8 oz (3.402 kg) F Vag-Spont EPI  LIV  2 Term 09/09/86 [redacted]w[redacted]d  8 lb 14 oz (4.026 kg) F Vag-Spont EPI  LIV  1 SAB 1986 [redacted]w[redacted]d           Past Medical History:  Diagnosis Date   Abnormal uterine  bleeding (AUB)    Anemia    Fibroid    Gastric ulcer    History of anemia 1985   History of blood transfusion 1983   Vaginal Pap smear, abnormal     Past Surgical History:  Procedure Laterality Date   BREAST ENHANCEMENT SURGERY  2002   BREAST SURGERY     CHOLECYSTECTOMY     Age 15   GALLBLADDER SURGERY     IR RADIOLOGIST EVAL & MGMT  07/12/2020   TUBAL LIGATION       Current Outpatient Medications:    alum & mag hydroxide-simeth (MAALOX/MYLANTA) 200-200-20 MG/5ML suspension, Take 15 mLs by mouth every 6 (six) hours as needed for indigestion or heartburn. (Patient not taking: Reported on 06/25/2022), Disp: , Rfl:    ferrous sulfate 325 (65 FE) MG tablet, Take 1 tablet (325 mg total) by mouth daily with breakfast., Disp: 30 tablet, Rfl: 0   ibuprofen (ADVIL,MOTRIN) 200 MG tablet, Take 400 mg by mouth every 6 (six) hours as needed (pain). (Patient not taking: Reported on 06/25/2022), Disp: , Rfl:    megestrol (MEGACE) 40 MG tablet, Take 1 tablet (40 mg total) by mouth 2 (two) times daily. (Patient not taking: Reported on 06/30/2020), Disp: 60 tablet, Rfl: 3   metroNIDAZOLE (FLAGYL) 500 MG tablet, Take 1 tablet (500 mg total) by mouth 2 (two) times daily. (Patient not taking: Reported on  02/01/2022), Disp: 14 tablet, Rfl: 0   tranexamic acid (LYSTEDA) 650 MG TABS tablet, TAKE 2 TABLETS BY MOUTH THREE TIMES DAILY. TAKE DURING MENSES FOR A MAXIMUM OF 5 DAYS (Patient not taking: Reported on 06/25/2022), Disp: 30 tablet, Rfl: 1 No current facility-administered medications for this visit.  Facility-Administered Medications Ordered in Other Visits:    acetaminophen (TYLENOL) tablet 650 mg, 650 mg, Oral, Once, Griffin Basil, MD   diphenhydrAMINE (BENADRYL) capsule 25 mg, 25 mg, Oral, Once, Griffin Basil, MD   iron sucrose (VENOFER) 500 mg in sodium chloride 0.9 % 250 mL IVPB, 500 mg, Intravenous, Once, Griffin Basil, MD No Known Allergies  Social History   Tobacco Use   Smoking status: Never    Smokeless tobacco: Never  Substance Use Topics   Alcohol use: Yes    Alcohol/week: 1.0 standard drink of alcohol    Types: 1 Glasses of wine per week    Family History  Problem Relation Age of Onset   Hypertension Mother    Heart disease Mother    Hyperlipidemia Mother    Hearing loss Father    Hyperlipidemia Father    Cancer Maternal Grandfather    Hypertension Other    Cancer Other    Hyperlipidemia Other       Review of Systems  Constitutional: negative for fatigue and weight loss Respiratory: negative for cough and wheezing Cardiovascular: negative for chest pain, fatigue and palpitations Gastrointestinal: negative for abdominal pain and change in bowel habits Musculoskeletal:negative for myalgias Neurological: negative for gait problems and tremors Behavioral/Psych: negative for abusive relationship, depression Endocrine: negative for temperature intolerance    Genitourinary:negative for abnormal menstrual periods, genital lesions, hot flashes, sexual problems and vaginal discharge Integument/breast: negative for breast lump, breast tenderness, nipple discharge and skin lesion(s)    Objective:       BP 121/75   Pulse 73   Ht 5\' 4"  (1.626 m)   Wt 171 lb (77.6 kg)   LMP 03/08/2022 (Exact Date)   BMI 29.35 kg/m  General:   alert  Skin:   no rash or abnormalities  Lungs:   clear to auscultation bilaterally  Heart:   regular rate and rhythm, S1, S2 normal, no murmur, click, rub or gallop  Breasts:   normal without suspicious masses, skin or nipple changes or axillary nodes  Abdomen:  normal findings: no organomegaly, soft, non-tender and no hernia  Pelvis:  External genitalia: normal general appearance Urinary system: urethral meatus normal and bladder without fullness, nontender Vaginal: normal without tenderness, induration or masses Cervix: normal appearance Adnexa: normal bimanual exam Uterus: anteverted and non-tender, normal size   Lab Review Urine  pregnancy test Labs reviewed {YES NO:22349} Radiologic studies reviewed {YES NO:22349}  ***% of *** min visit spent on counseling and coordination of care.   Assessment:    Healthy female exam.    Plan:    {plan:19193}   No orders of the defined types were placed in this encounter.  No orders of the defined types were placed in this encounter.  Need to obtain previous records Possible management options include:*** Follow up as needed. Shelly Bombard, MD 06/25/2022 4:24 PM

## 2022-06-25 NOTE — Progress Notes (Addendum)
57 y.o. GYN presents for AEX/PAP/STD Screening.  C/o hot flashes, insomnia, irregular periods. Last Mammogram 02/07/2022

## 2022-06-26 ENCOUNTER — Other Ambulatory Visit: Payer: Self-pay | Admitting: Obstetrics

## 2022-06-26 LAB — COMPREHENSIVE METABOLIC PANEL
ALT: 13 IU/L (ref 0–32)
AST: 16 IU/L (ref 0–40)
Albumin/Globulin Ratio: 1.3 (ref 1.2–2.2)
Albumin: 4.2 g/dL (ref 3.8–4.9)
Alkaline Phosphatase: 87 IU/L (ref 44–121)
BUN/Creatinine Ratio: 14 (ref 9–23)
BUN: 11 mg/dL (ref 6–24)
Bilirubin Total: 0.8 mg/dL (ref 0.0–1.2)
CO2: 25 mmol/L (ref 20–29)
Calcium: 9.1 mg/dL (ref 8.7–10.2)
Chloride: 105 mmol/L (ref 96–106)
Creatinine, Ser: 0.79 mg/dL (ref 0.57–1.00)
Globulin, Total: 3.2 g/dL (ref 1.5–4.5)
Glucose: 94 mg/dL (ref 70–99)
Potassium: 4.2 mmol/L (ref 3.5–5.2)
Sodium: 141 mmol/L (ref 134–144)
Total Protein: 7.4 g/dL (ref 6.0–8.5)
eGFR: 89 mL/min/{1.73_m2} (ref >59)

## 2022-06-26 LAB — CBC
Hematocrit: 32.6 % — ABNORMAL LOW (ref 34.0–46.6)
Hemoglobin: 8.9 g/dL — ABNORMAL LOW (ref 11.1–15.9)
MCH: 18.5 pg — ABNORMAL LOW (ref 26.6–33.0)
MCHC: 27.3 g/dL — ABNORMAL LOW (ref 31.5–35.7)
MCV: 68 fL — ABNORMAL LOW (ref 79–97)
Platelets: 313 10*3/uL (ref 150–450)
RBC: 4.81 x10E6/uL (ref 3.77–5.28)
RDW: 18.5 % — ABNORMAL HIGH (ref 11.7–15.4)
WBC: 8.2 10*3/uL (ref 3.4–10.8)

## 2022-06-26 LAB — FERRITIN: Ferritin: 5 ng/mL — ABNORMAL LOW (ref 15–150)

## 2022-06-26 LAB — HEMOGLOBIN A1C
Est. average glucose Bld gHb Est-mCnc: 123 mg/dL
Hgb A1c MFr Bld: 5.9 % — ABNORMAL HIGH (ref 4.8–5.6)

## 2022-06-26 LAB — RPR: RPR Ser Ql: NONREACTIVE

## 2022-06-26 LAB — HIV ANTIBODY (ROUTINE TESTING W REFLEX): HIV Screen 4th Generation wRfx: NONREACTIVE

## 2022-06-26 LAB — HEPATITIS B SURFACE ANTIGEN: Hepatitis B Surface Ag: NEGATIVE

## 2022-06-26 LAB — HEPATITIS C ANTIBODY: Hep C Virus Ab: NONREACTIVE

## 2022-06-27 LAB — CERVICOVAGINAL ANCILLARY ONLY
Bacterial Vaginitis (gardnerella): POSITIVE — AB
Candida Glabrata: NEGATIVE
Candida Vaginitis: NEGATIVE
Chlamydia: NEGATIVE
Comment: NEGATIVE
Comment: NEGATIVE
Comment: NEGATIVE
Comment: NEGATIVE
Comment: NEGATIVE
Comment: NORMAL
Neisseria Gonorrhea: NEGATIVE
Trichomonas: NEGATIVE

## 2022-06-28 ENCOUNTER — Other Ambulatory Visit: Payer: Self-pay | Admitting: Obstetrics

## 2022-06-28 ENCOUNTER — Other Ambulatory Visit: Payer: Self-pay

## 2022-06-28 DIAGNOSIS — R232 Flushing: Secondary | ICD-10-CM

## 2022-06-28 DIAGNOSIS — N760 Acute vaginitis: Secondary | ICD-10-CM

## 2022-06-28 LAB — CYTOLOGY - PAP
Comment: NEGATIVE
Diagnosis: NEGATIVE
High risk HPV: NEGATIVE

## 2022-06-28 MED ORDER — CLIMARA PRO 0.045-0.015 MG/DAY TD PTWK
1.0000 | MEDICATED_PATCH | TRANSDERMAL | 12 refills | Status: DC
Start: 2022-06-28 — End: 2022-10-10

## 2022-06-28 MED ORDER — METRONIDAZOLE 500 MG PO TABS: 500.0000 mg | ORAL_TABLET | Freq: Two times a day (BID) | ORAL | 0 refills | Status: DC

## 2022-10-10 ENCOUNTER — Ambulatory Visit (INDEPENDENT_AMBULATORY_CARE_PROVIDER_SITE_OTHER): Payer: 59 | Admitting: Obstetrics and Gynecology

## 2022-10-10 ENCOUNTER — Other Ambulatory Visit: Payer: Self-pay | Admitting: Obstetrics and Gynecology

## 2022-10-10 ENCOUNTER — Other Ambulatory Visit (HOSPITAL_COMMUNITY)
Admission: RE | Admit: 2022-10-10 | Discharge: 2022-10-10 | Disposition: A | Discharge status: Home / Self Care | Payer: 59 | Source: Ambulatory Visit | Attending: Obstetrics and Gynecology | Admitting: Obstetrics and Gynecology

## 2022-10-10 VITALS — BP 121/79 | HR 71 | Wt 170.0 lb

## 2022-10-10 DIAGNOSIS — N898 Other specified noninflammatory disorders of vagina: Secondary | ICD-10-CM

## 2022-10-10 DIAGNOSIS — D259 Leiomyoma of uterus, unspecified: Secondary | ICD-10-CM | POA: Diagnosis not present

## 2022-10-10 DIAGNOSIS — D219 Benign neoplasm of connective and other soft tissue, unspecified: Secondary | ICD-10-CM

## 2022-10-10 DIAGNOSIS — D500 Iron deficiency anemia secondary to blood loss (chronic): Secondary | ICD-10-CM | POA: Diagnosis not present

## 2022-10-10 DIAGNOSIS — N924 Excessive bleeding in the premenopausal period: Secondary | ICD-10-CM

## 2022-10-10 DIAGNOSIS — Z862 Personal history of diseases of the blood and blood-forming organs and certain disorders involving the immune mechanism: Secondary | ICD-10-CM

## 2022-10-10 MED ORDER — DIPHENHYDRAMINE HCL 25 MG PO CAPS
25.0000 mg | ORAL_CAPSULE | Freq: Once | ORAL | Status: DC
Start: 2022-10-10 — End: 2022-10-10

## 2022-10-10 MED ORDER — MYFEMBREE 40-1-0.5 MG PO TABS
1.0000 | ORAL_TABLET | Freq: Every day | ORAL | 12 refills | Status: AC
Start: 2022-10-10 — End: ?

## 2022-10-10 MED ORDER — ACETAMINOPHEN 325 MG PO TABS
650.0000 mg | ORAL_TABLET | Freq: Once | ORAL | Status: DC
Start: 2022-10-10 — End: 2022-10-10

## 2022-10-10 NOTE — Patient Instructions (Signed)
[email or web address]  go to this site for savings card and more information on the drug

## 2022-10-10 NOTE — Progress Notes (Signed)
  CC: fibroid /bleeding follow up Subjective:    Patient ID: Gabrielle Hines, female    DOB: 1965/04/22, 57 y.o.   MRN: [id number]  HPI 57 yo seen for follow up of vaginal bleeding.  Pt has known fibroids.  She is having intermittent vaginal bleeding.  This started about 2 weeks ago after intercourse.  Prior to that she also had some bleeding 2 weeks before as well.  Pt has been offered UFE in the past, but she did not follow through.  We discussed other treatment options, namely myfembree or progesterone IUD.  Pt desires to try myfembree.  Pt has normal pap and a previously normal endometrial biopsy.   Review of Systems     Objective:   Physical Exam Vitals:   10/10/22 1118  BP: 121/79  Pulse: 71         Assessment & Plan:   1. Vaginal discharge Per pt request - Cervicovaginal ancillary only( [facility])  2. Abnormal perimenopausal bleeding Pt desired check on hgb, iron infusion ordered.  Trial of myfembree to address fibroids and bleeding. - CBC - Relugolix-Estradiol-Norethind (MYFEMBREE) 40-1-0.5 MG TABS; Take 1 tablet by mouth daily.  Dispense: 28 tablet; Refill: 12 - AMB NURSING COMMUNICATION 1; Standing - acetaminophen (TYLENOL) tablet 650 mg - diphenhydrAMINE (BENADRYL) capsule 25 mg - AMB NURSING COMMUNICATION 1  3. Fibroid  - CBC - Relugolix-Estradiol-Norethind (MYFEMBREE) 40-1-0.5 MG TABS; Take 1 tablet by mouth daily.  Dispense: 28 tablet; Refill: 12  4. Hx of iron deficiency anemia  - CBC - Relugolix-Estradiol-Norethind (MYFEMBREE) 40-1-0.5 MG TABS; Take 1 tablet by mouth daily.  Dispense: 28 tablet; Refill: 12  5. Iron deficiency anemia due to chronic blood loss  - AMB NURSING COMMUNICATION 1; Standing - acetaminophen (TYLENOL) tablet 650 mg - diphenhydrAMINE (BENADRYL) capsule 25 mg - AMB NURSING COMMUNICATION 1  F/u in 6 months  I spent 20 minutes dedicated to the care of this patient including previsit review of records, face to face time with  the patient discussing treatment options and post visit testing.   Warden Fillers, MD Faculty Attending, Center for [facility]

## 2022-10-10 NOTE — Progress Notes (Signed)
Pt was seen by Dr Clearance Coots in April for AEX.  Noted some hot flashes and was given Rx.  Pt states Rx too expensive.   Pt was given information on Lupron last year, may want to review that again. Pt states she doesn't want surgical intervention.  Pt states she is having irregular bleeding and vaginal odor- self swab today.   Pt asking for referral to infusion clinic for Iron.  Pt would like labs today.

## 2022-10-11 LAB — CBC
Hematocrit: 30.7 % — ABNORMAL LOW (ref 34.0–46.6)
Hemoglobin: 8.3 g/dL — ABNORMAL LOW (ref 11.1–15.9)
MCH: 17.8 pg — ABNORMAL LOW (ref 26.6–33.0)
MCHC: 27 g/dL — ABNORMAL LOW (ref 31.5–35.7)
MCV: 66 fL — ABNORMAL LOW (ref 79–97)
Platelets: 369 10*3/uL (ref 150–450)
RBC: 4.67 x10E6/uL (ref 3.77–5.28)
RDW: 18.4 % — ABNORMAL HIGH (ref 11.7–15.4)
WBC: 7.4 10*3/uL (ref 3.4–10.8)

## 2022-10-12 LAB — CERVICOVAGINAL ANCILLARY ONLY
Bacterial Vaginitis (gardnerella): POSITIVE — AB
Candida Glabrata: NEGATIVE
Candida Vaginitis: POSITIVE — AB
Comment: NEGATIVE
Comment: NEGATIVE
Comment: NEGATIVE
Comment: NEGATIVE
Trichomonas: NEGATIVE

## 2022-10-15 ENCOUNTER — Telehealth: Payer: Self-pay

## 2022-10-15 DIAGNOSIS — B3731 Acute candidiasis of vulva and vagina: Secondary | ICD-10-CM

## 2022-10-15 DIAGNOSIS — B9689 Other specified bacterial agents as the cause of diseases classified elsewhere: Secondary | ICD-10-CM

## 2022-10-15 MED ORDER — METRONIDAZOLE 500 MG PO TABS
500.0000 mg | ORAL_TABLET | Freq: Two times a day (BID) | ORAL | 0 refills | Status: AC
Start: 2022-10-15 — End: 2022-10-22

## 2022-10-15 MED ORDER — FLUCONAZOLE 150 MG PO TABS
150.0000 mg | ORAL_TABLET | Freq: Once | ORAL | 0 refills | Status: AC
Start: 2022-10-15 — End: 2022-10-15

## 2022-10-15 NOTE — Telephone Encounter (Signed)
-----   Message from Warden Fillers sent at 10/14/2022 11:44 PM EDT ----- BV and yeast noted on swab, offer treatment

## 2022-10-15 NOTE — Telephone Encounter (Signed)
I connected with  Gabrielle Hines on 10/15/22 by telephone and verified that I am speaking with the correct person using two identifiers.  Patient informed of labs results and has no further questions.  Rx sent.

## 2022-11-12 ENCOUNTER — Other Ambulatory Visit: Payer: Self-pay | Admitting: Obstetrics and Gynecology

## 2022-11-12 DIAGNOSIS — D649 Anemia, unspecified: Secondary | ICD-10-CM

## 2022-11-12 DIAGNOSIS — D500 Iron deficiency anemia secondary to blood loss (chronic): Secondary | ICD-10-CM

## 2022-11-12 DIAGNOSIS — N924 Excessive bleeding in the premenopausal period: Secondary | ICD-10-CM

## 2022-11-12 MED ORDER — ANTICOAGULANT SODIUM CITRATE 4% (200MG/5ML) IV SOLN
5.0000 mL | Freq: Once | Status: DC | PRN
Start: 2022-11-12 — End: 2022-11-12

## 2022-11-12 MED ORDER — SODIUM CHLORIDE 0.9% FLUSH
10.0000 mL | Freq: Once | INTRAVENOUS | Status: DC | PRN
Start: 2022-11-12 — End: 2022-11-12

## 2022-11-12 MED ORDER — METHYLPREDNISOLONE SODIUM SUCC 125 MG IJ SOLR: 125.0000 mg | Freq: Once | INTRAMUSCULAR | Status: DC | PRN

## 2022-11-12 MED ORDER — HEPARIN SOD (PORK) LOCK FLUSH 100 UNIT/ML IV SOLN
500.0000 [IU] | Freq: Once | INTRAVENOUS | Status: DC | PRN
Start: 2022-11-12 — End: 2022-11-12

## 2022-11-12 MED ORDER — EPINEPHRINE 0.3 MG/0.3ML IJ SOAJ
0.3000 mg | Freq: Once | INTRAMUSCULAR | Status: DC | PRN
Start: 2022-11-12 — End: 2022-11-12

## 2022-11-12 MED ORDER — ACETAMINOPHEN 325 MG PO TABS
650.0000 mg | ORAL_TABLET | Freq: Once | ORAL | Status: DC
Start: 2022-11-12 — End: 2022-11-12

## 2022-11-12 MED ORDER — DIPHENHYDRAMINE HCL 50 MG/ML IJ SOLN: 50.0000 mg | Freq: Once | INTRAMUSCULAR | Status: DC | PRN

## 2022-11-12 MED ORDER — DIPHENHYDRAMINE HCL 25 MG PO CAPS
25.0000 mg | ORAL_CAPSULE | Freq: Once | ORAL | Status: DC
Start: 2022-11-12 — End: 2022-11-12

## 2022-11-12 MED ORDER — SODIUM CHLORIDE 0.9% FLUSH
3.0000 mL | Freq: Once | INTRAVENOUS | Status: DC | PRN
Start: 2022-11-12 — End: 2022-11-12

## 2022-11-12 MED ORDER — SODIUM CHLORIDE 0.9 % IV SOLN
Freq: Once | INTRAVENOUS | Status: DC | PRN
Start: 2022-11-12 — End: 2022-11-12

## 2022-11-12 MED ORDER — ALBUTEROL SULFATE HFA 108 (90 BASE) MCG/ACT IN AERS
2.0000 | INHALATION_SPRAY | Freq: Once | RESPIRATORY_TRACT | Status: DC | PRN
Start: 2022-11-12 — End: 2022-11-12

## 2022-11-12 MED ORDER — FAMOTIDINE IN NACL 20-0.9 MG/50ML-% IV SOLN
20.0000 mg | Freq: Once | INTRAVENOUS | Status: DC | PRN
Start: 2022-11-12 — End: 2022-11-12

## 2022-11-12 MED ORDER — HEPARIN SOD (PORK) LOCK FLUSH 100 UNIT/ML IV SOLN
250.0000 [IU] | Freq: Once | INTRAVENOUS | Status: DC | PRN
Start: 2022-11-12 — End: 2022-11-12

## 2022-11-12 MED ORDER — ALTEPLASE 2 MG IJ SOLR: 2.0000 mg | Freq: Once | INTRAMUSCULAR | Status: DC | PRN

## 2022-11-22 ENCOUNTER — Ambulatory Visit (INDEPENDENT_AMBULATORY_CARE_PROVIDER_SITE_OTHER): Payer: Self-pay

## 2022-11-22 VITALS — BP 112/70 | HR 69 | Temp 98.9°F | Resp 16 | Ht 64.0 in | Wt 168.9 lb

## 2022-11-22 DIAGNOSIS — N924 Excessive bleeding in the premenopausal period: Secondary | ICD-10-CM

## 2022-11-22 DIAGNOSIS — D500 Iron deficiency anemia secondary to blood loss (chronic): Secondary | ICD-10-CM

## 2022-11-22 MED ORDER — DIPHENHYDRAMINE HCL 25 MG PO CAPS
25.0000 mg | ORAL_CAPSULE | Freq: Once | ORAL | Status: AC
  Administered 2022-11-22: 25 mg via ORAL
  Filled 2022-11-22: qty 1

## 2022-11-22 MED ORDER — IRON SUCROSE 500 MG IVPB - SIMPLE MED
500.0000 mg | Freq: Once | INTRAVENOUS | Status: AC
  Administered 2022-11-22: 500 mg via INTRAVENOUS
  Filled 2022-11-22: qty 500

## 2022-11-22 MED ORDER — ACETAMINOPHEN 325 MG PO TABS
650.0000 mg | ORAL_TABLET | Freq: Once | ORAL | Status: AC
  Administered 2022-11-22: 650 mg via ORAL
  Filled 2022-11-22: qty 2

## 2022-11-22 NOTE — Progress Notes (Signed)
Diagnosis: Iron Deficiency Anemia  Provider:  Chilton Greathouse MD  Procedure: IV Infusion  IV Type: Peripheral, IV Location: L Forearm  Venofer (Iron Sucrose), Dose: 500 mg  Infusion Start Time: 0910  Infusion Stop Time: 1334  Post Infusion IV Care: Patient declined observation and Peripheral IV Discontinued  Discharge: Condition: Good, Destination: Home . AVS Provided  Performed by:  Adriana Mccallum, RN
